# Patient Record
Sex: Female | Born: 1960 | Race: White | Hispanic: No | Smoking: Former smoker
Health system: Southern US, Community
[De-identification: ages and names within clinical notes are randomized; demographics above are authoritative.]

## PROBLEM LIST (undated history)

## (undated) DIAGNOSIS — J45909 Unspecified asthma, uncomplicated: Secondary | ICD-10-CM

## (undated) DIAGNOSIS — T79A22A Traumatic compartment syndrome of left lower extremity, initial encounter: Secondary | ICD-10-CM

## (undated) DIAGNOSIS — F419 Anxiety disorder, unspecified: Secondary | ICD-10-CM

## (undated) DIAGNOSIS — C50919 Malignant neoplasm of unspecified site of unspecified female breast: Secondary | ICD-10-CM

## (undated) DIAGNOSIS — R569 Unspecified convulsions: Secondary | ICD-10-CM

## (undated) DIAGNOSIS — F32A Depression, unspecified: Secondary | ICD-10-CM

## (undated) DIAGNOSIS — F329 Major depressive disorder, single episode, unspecified: Secondary | ICD-10-CM

## (undated) DIAGNOSIS — S7400XA Injury of sciatic nerve at hip and thigh level, unspecified leg, initial encounter: Secondary | ICD-10-CM

## (undated) DIAGNOSIS — I82409 Acute embolism and thrombosis of unspecified deep veins of unspecified lower extremity: Secondary | ICD-10-CM

## (undated) DIAGNOSIS — D649 Anemia, unspecified: Secondary | ICD-10-CM

## (undated) HISTORY — PX: CERVICAL FUSION: SHX112

## (undated) HISTORY — PX: FASCIOTOMY: SHX132

## (undated) HISTORY — PX: LUMBAR FUSION: SHX111

## (undated) HISTORY — PX: SPINAL CORD STIMULATOR IMPLANT: SHX2422

## (undated) HISTORY — PX: MASTECTOMY: SHX3

## (undated) HISTORY — PX: TONSILLECTOMY: SUR1361

---

## 2009-08-24 ENCOUNTER — Emergency Department: Payer: Self-pay | Admitting: Emergency Medicine

## 2011-03-07 ENCOUNTER — Ambulatory Visit: Payer: Self-pay

## 2013-06-25 ENCOUNTER — Ambulatory Visit: Payer: Self-pay

## 2015-12-14 ENCOUNTER — Emergency Department: Payer: Medicare Other

## 2015-12-14 ENCOUNTER — Emergency Department
Admission: EM | Admit: 2015-12-14 | Discharge: 2015-12-14 | Disposition: A | Discharge status: Home / Self Care | Payer: Medicare Other | Attending: Emergency Medicine | Admitting: Emergency Medicine

## 2015-12-14 DIAGNOSIS — R05 Cough: Secondary | ICD-10-CM | POA: Insufficient documentation

## 2015-12-14 DIAGNOSIS — R0981 Nasal congestion: Secondary | ICD-10-CM | POA: Insufficient documentation

## 2015-12-14 DIAGNOSIS — M79605 Pain in left leg: Secondary | ICD-10-CM

## 2015-12-14 HISTORY — DX: Acute embolism and thrombosis of unspecified deep veins of unspecified lower extremity: I82.409

## 2015-12-14 HISTORY — DX: Traumatic compartment syndrome of left lower extremity, initial encounter: T79.A22A

## 2015-12-14 HISTORY — DX: Injury of sciatic nerve at hip and thigh level, unspecified leg, initial encounter: S74.00XA

## 2015-12-14 NOTE — ED Provider Notes (Signed)
[facility] Emergency Department Provider Note  ____________________________________________  Time seen: Approximately 2:51 PM  I have reviewed the triage vital signs and the nursing notes.   HISTORY  Chief Complaint Leg Pain    HPI Rebecca Colon is a 55 y.o. female who complains of gradual onset left leg pain behind the knee for about 2 weeks. No associated chest pain shortness of breath or exertional symptoms. No recent travel, hospitalization or surgery. Does have a history of DVT and reports this feels similar. No significant swelling of the leg. No fevers or chills     Past Medical History:  Diagnosis Date  . 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 DVT (deep venous thrombosis) ([place])   . Sciatic nerve injury      There are no active problems to display for this patient.    Past Surgical History:  Procedure Laterality Date  . CERVICAL FUSION    . FASCIOTOMY Left   . LUMBAR FUSION    . SPINAL CORD STIMULATOR IMPLANT    . TONSILLECTOMY       Prior to Admission medications   Not on File     Allergies Penicillins   No family history on file.  Social History Social History  Substance Use Topics  . Smoking status: Never Smoker  . Smokeless tobacco: Never Used  . Alcohol use No    Review of Systems  Constitutional:   No fever or chills.  ENT:  Recent illness with sinus congestion and drainage. Cardiovascular:   No chest pain. No pleuritic symptoms Respiratory:   No dyspnea positive nonproductive cough. Gastrointestinal:   Negative for abdominal pain, vomiting and diarrhea.  Genitourinary:   Negative for dysuria or difficulty urinating. Musculoskeletal:   Negative for focal pain or swelling Neurological:   Negative for headaches 10-point ROS otherwise negative.  ____________________________________________   PHYSICAL EXAM:  VITAL SIGNS: ED Triage Vitals  Enc Vitals Group     BP 12/14/15 1148 128/76      Pulse Rate 12/14/15 1148 65     Resp 12/14/15 1148 18     Temp 12/14/15 1148 97.9 F (36.6 C)     Temp Source 12/14/15 1148 Oral     SpO2 12/14/15 1148 95 %     Weight 12/14/15 1149 200 lb (90.7 kg)     Height 12/14/15 1149 5\' 7"  (1.702 m)     Head Circumference --      Peak Flow --      Pain Score 12/14/15 1144 4     Pain Loc --      Pain Edu? --      Excl. in [place]? --     Vital signs reviewed, nursing assessments reviewed.   Constitutional:   Alert and oriented. Well appearing and in no distress. Eyes:   No scleral icterus. No conjunctival pallor. PERRL. EOMI.  No nystagmus. ENT   Head:   Normocephalic and atraumatic.   Nose:   No congestion/rhinnorhea. No septal hematoma   Mouth/Throat:   MMM, no pharyngeal erythema. No peritonsillar mass.    Neck:   No stridor. No SubQ emphysema. No meningismus. Hematological/Lymphatic/Immunilogical:   No cervical lymphadenopathy. Cardiovascular:   RRR. Symmetric bilateral radial and DP pulses.  No murmurs.  Respiratory:   Normal respiratory effort without tachypnea nor retractions. Breath sounds are clear and equal bilaterally. No wheezes/rales/rhonchi. Gastrointestinal:   Soft and nontender. Non distended. There is no CVA tenderness.  No rebound, rigidity,  or guarding. Genitourinary:   deferred Musculoskeletal:   Slight left popliteal tenderness without mass. No inflammatory changes or induration or swelling. Calf circumference is symmetric.  No edema. Neurologic:   Normal speech and language.  CN 2-10 normal. Motor grossly intact. No gross focal neurologic deficits are appreciated.  Skin:    Skin is warm, dry and intact. No rash noted.  No petechiae, purpura, or bullae.  ____________________________________________    LABS (pertinent positives/negatives) (all labs ordered are listed, but only abnormal results are displayed) Labs Reviewed - No data to  display ____________________________________________   EKG    ____________________________________________    RADIOLOGY  Ultrasound left lower extremity unremarkable  ____________________________________________   PROCEDURES Procedures  ____________________________________________   INITIAL IMPRESSION / ASSESSMENT AND PLAN / ED COURSE  Pertinent labs & imaging results that were available during my care of the patient were reviewed by me and considered in my medical decision making (see chart for details).  Patient well appearing no acute distress. Complains of left leg pain concerning for DVT. Ultrasound is negative. On exam there is not a high suspicion for DVT. No further workup or anticoagulants indicated at this time. Encouraged follow-up with primary care week for reevaluation of her symptoms and progression. NSAIDs and heat therapy for now. No evidence of soft tissue infection or acute injury.     Clinical Course   ____________________________________________   FINAL CLINICAL IMPRESSION(S) / ED DIAGNOSES  Final diagnoses:  Left leg pain       Portions of this note were generated with dragon dictation software. Dictation errors may occur despite best attempts at proofreading.    Carrie Mew, MD 12/14/15 [phone]

## 2015-12-14 NOTE — ED Triage Notes (Signed)
Pt c/o pain behind the left knee for the past couple of weeks with increased SOB.. Pt states she has a hx of DVT.Marland Kitchen

## 2015-12-14 NOTE — ED Notes (Addendum)
Pt c/o L knee pain for "a couple months".  Pt sts she feels mass when bending knee, incr pain w/ bending knee and sometimes flexion of foot.  No swelling, redness or heat bilaterally. Circulation and sensation intact.   Pt sts she has had congestion for 3-4 days.  Denies recent travel.

## 2015-12-14 NOTE — ED Notes (Signed)
Patient transported to Ultrasound 

## 2017-07-07 ENCOUNTER — Encounter: Payer: Self-pay | Admitting: *Deleted

## 2017-07-07 ENCOUNTER — Observation Stay
Admission: EM | Admit: 2017-07-07 | Discharge: 2017-07-10 | Disposition: A | Discharge status: Home / Self Care | Payer: Medicare Other | Attending: Internal Medicine | Admitting: Internal Medicine

## 2017-07-07 ENCOUNTER — Emergency Department: Payer: Medicare Other

## 2017-07-07 DIAGNOSIS — F419 Anxiety disorder, unspecified: Secondary | ICD-10-CM | POA: Diagnosis not present

## 2017-07-07 DIAGNOSIS — Z853 Personal history of malignant neoplasm of breast: Secondary | ICD-10-CM | POA: Diagnosis not present

## 2017-07-07 DIAGNOSIS — G9340 Encephalopathy, unspecified: Secondary | ICD-10-CM | POA: Diagnosis not present

## 2017-07-07 DIAGNOSIS — Z9013 Acquired absence of bilateral breasts and nipples: Secondary | ICD-10-CM | POA: Diagnosis not present

## 2017-07-07 DIAGNOSIS — J45909 Unspecified asthma, uncomplicated: Secondary | ICD-10-CM | POA: Diagnosis not present

## 2017-07-07 DIAGNOSIS — F329 Major depressive disorder, single episode, unspecified: Secondary | ICD-10-CM | POA: Insufficient documentation

## 2017-07-07 DIAGNOSIS — Z79891 Long term (current) use of opiate analgesic: Secondary | ICD-10-CM | POA: Insufficient documentation

## 2017-07-07 DIAGNOSIS — R112 Nausea with vomiting, unspecified: Principal | ICD-10-CM | POA: Insufficient documentation

## 2017-07-07 DIAGNOSIS — Z86718 Personal history of other venous thrombosis and embolism: Secondary | ICD-10-CM | POA: Insufficient documentation

## 2017-07-07 DIAGNOSIS — Z23 Encounter for immunization: Secondary | ICD-10-CM | POA: Diagnosis not present

## 2017-07-07 DIAGNOSIS — N3000 Acute cystitis without hematuria: Secondary | ICD-10-CM | POA: Diagnosis not present

## 2017-07-07 DIAGNOSIS — G8929 Other chronic pain: Secondary | ICD-10-CM | POA: Insufficient documentation

## 2017-07-07 DIAGNOSIS — R4182 Altered mental status, unspecified: Secondary | ICD-10-CM

## 2017-07-07 DIAGNOSIS — Z79899 Other long term (current) drug therapy: Secondary | ICD-10-CM | POA: Diagnosis not present

## 2017-07-07 DIAGNOSIS — L739 Follicular disorder, unspecified: Secondary | ICD-10-CM | POA: Diagnosis not present

## 2017-07-07 DIAGNOSIS — F32A Depression, unspecified: Secondary | ICD-10-CM | POA: Diagnosis present

## 2017-07-07 DIAGNOSIS — M549 Dorsalgia, unspecified: Secondary | ICD-10-CM | POA: Insufficient documentation

## 2017-07-07 DIAGNOSIS — N390 Urinary tract infection, site not specified: Secondary | ICD-10-CM

## 2017-07-07 HISTORY — DX: Unspecified asthma, uncomplicated: J45.909

## 2017-07-07 HISTORY — DX: Anemia, unspecified: D64.9

## 2017-07-07 HISTORY — DX: Anxiety disorder, unspecified: F41.9

## 2017-07-07 HISTORY — DX: Major depressive disorder, single episode, unspecified: F32.9

## 2017-07-07 HISTORY — DX: Depression, unspecified: F32.A

## 2017-07-07 LAB — CBC WITH DIFFERENTIAL/PLATELET
Basophils Absolute: 0 10*3/uL (ref 0–0.1)
Basophils Relative: 0 %
Eosinophils Absolute: 0 10*3/uL (ref 0–0.7)
Eosinophils Relative: 0 %
HEMATOCRIT: 43.3 % (ref 35.0–47.0)
Hemoglobin: 15.2 g/dL (ref 12.0–16.0)
LYMPHS PCT: 8 %
Lymphs Abs: 1 10*3/uL (ref 1.0–3.6)
MCH: 30.5 pg (ref 26.0–34.0)
MCHC: 35.2 g/dL (ref 32.0–36.0)
MCV: 86.5 fL (ref 80.0–100.0)
MONOS PCT: 8 %
Monocytes Absolute: 0.9 10*3/uL (ref 0.2–0.9)
NEUTROS ABS: 9.9 10*3/uL — AB (ref 1.4–6.5)
Neutrophils Relative %: 84 %
Platelets: 260 10*3/uL (ref 150–440)
RBC: 5 MIL/uL (ref 3.80–5.20)
RDW: 12.9 % (ref 11.5–14.5)
WBC: 11.9 10*3/uL — ABNORMAL HIGH (ref 3.6–11.0)

## 2017-07-07 LAB — URINALYSIS, COMPLETE (UACMP) WITH MICROSCOPIC
BILIRUBIN URINE: NEGATIVE
GLUCOSE, UA: NEGATIVE mg/dL
KETONES UR: 80 mg/dL — AB
Nitrite: NEGATIVE
PH: 5 (ref 5.0–8.0)
Protein, ur: 30 mg/dL — AB
Specific Gravity, Urine: 1.029 (ref 1.005–1.030)

## 2017-07-07 LAB — COMPREHENSIVE METABOLIC PANEL
ALT: 20 U/L (ref 14–54)
ANION GAP: 11 (ref 5–15)
AST: 30 U/L (ref 15–41)
Albumin: 4 g/dL (ref 3.5–5.0)
Alkaline Phosphatase: 84 U/L (ref 38–126)
BILIRUBIN TOTAL: 1.2 mg/dL (ref 0.3–1.2)
BUN: 17 mg/dL (ref 6–20)
CO2: 23 mmol/L (ref 22–32)
Calcium: 9.4 mg/dL (ref 8.9–10.3)
Chloride: 103 mmol/L (ref 101–111)
Creatinine, Ser: 0.72 mg/dL (ref 0.44–1.00)
Glucose, Bld: 122 mg/dL — ABNORMAL HIGH (ref 65–99)
POTASSIUM: 3.4 mmol/L — AB (ref 3.5–5.1)
Sodium: 137 mmol/L (ref 135–145)
TOTAL PROTEIN: 7.6 g/dL (ref 6.5–8.1)

## 2017-07-07 LAB — TROPONIN I: Troponin I: <0.03 ng/mL (ref <0.03)

## 2017-07-07 LAB — LACTIC ACID, PLASMA: Lactic Acid, Venous: 1.8 mmol/L (ref 0.5–1.9)

## 2017-07-07 NOTE — ED Notes (Signed)
Patient's daughter states she believes the patient has been without Lyrica for at least two days. Daughter found the patient's meds in an unusual spot and feels the patient forgot about the meds or slept through the times to have the meds.

## 2017-07-07 NOTE — ED Notes (Addendum)
Patient told her daughter she was unsure if she took all of her medications today. Patient was holding her legs off the stretcher and had a rapid scissor kick. Patient's daughter was worried that the patient didn't take her Lyrica for restless legs and is having withdrawals. Patient's daughter was concerned that the patient would need to be taken to [facility] for the withdrawal if "things don't happen soon." Daughter was reassured that patient would receive an IV and blood drawn and the patient would need to provide a urine specimen and that all could be done here and had been ordered. Daughter appeared to be more calm.

## 2017-07-07 NOTE — ED Triage Notes (Addendum)
Per EMS report, patient's daughter states patient has been "not acting her self" for 3 days. Patient is unable to state the date or history, but is oriented to self.  Patient was able to tell tech that right arm is protected from use due to previous "shunt."

## 2017-07-08 ENCOUNTER — Other Ambulatory Visit: Payer: Self-pay

## 2017-07-08 ENCOUNTER — Encounter: Payer: Self-pay | Admitting: Internal Medicine

## 2017-07-08 DIAGNOSIS — F419 Anxiety disorder, unspecified: Secondary | ICD-10-CM | POA: Diagnosis present

## 2017-07-08 DIAGNOSIS — F32A Depression, unspecified: Secondary | ICD-10-CM | POA: Diagnosis present

## 2017-07-08 DIAGNOSIS — F329 Major depressive disorder, single episode, unspecified: Secondary | ICD-10-CM | POA: Diagnosis present

## 2017-07-08 DIAGNOSIS — N390 Urinary tract infection, site not specified: Secondary | ICD-10-CM | POA: Diagnosis present

## 2017-07-08 DIAGNOSIS — R112 Nausea with vomiting, unspecified: Secondary | ICD-10-CM | POA: Diagnosis not present

## 2017-07-08 DIAGNOSIS — G9340 Encephalopathy, unspecified: Secondary | ICD-10-CM | POA: Diagnosis present

## 2017-07-08 LAB — CBC
HEMATOCRIT: 44.6 % (ref 35.0–47.0)
HEMOGLOBIN: 15.6 g/dL (ref 12.0–16.0)
MCH: 30.6 pg (ref 26.0–34.0)
MCHC: 35.1 g/dL (ref 32.0–36.0)
MCV: 87.3 fL (ref 80.0–100.0)
Platelets: 263 10*3/uL (ref 150–440)
RBC: 5.11 MIL/uL (ref 3.80–5.20)
RDW: 13.1 % (ref 11.5–14.5)
WBC: 10.1 10*3/uL (ref 3.6–11.0)

## 2017-07-08 LAB — BASIC METABOLIC PANEL
ANION GAP: 10 (ref 5–15)
BUN: 17 mg/dL (ref 6–20)
CHLORIDE: 102 mmol/L (ref 101–111)
CO2: 26 mmol/L (ref 22–32)
Calcium: 9.4 mg/dL (ref 8.9–10.3)
Creatinine, Ser: 0.7 mg/dL (ref 0.44–1.00)
GFR calc Af Amer: >60 mL/min (ref >60)
GLUCOSE: 108 mg/dL — AB (ref 65–99)
POTASSIUM: 3.6 mmol/L (ref 3.5–5.1)
Sodium: 138 mmol/L (ref 135–145)

## 2017-07-08 LAB — LACTIC ACID, PLASMA: Lactic Acid, Venous: 1.2 mmol/L (ref 0.5–1.9)

## 2017-07-08 MED ORDER — SODIUM CHLORIDE 0.9 % IV SOLN
1.0000 g | Freq: Once | INTRAVENOUS | Status: AC
  Administered 2017-07-08: 1 g via INTRAVENOUS
  Filled 2017-07-08: qty 10

## 2017-07-08 MED ORDER — ONDANSETRON HCL 4 MG/2ML IJ SOLN: 4.0000 mg | Freq: Four times a day (QID) | INTRAMUSCULAR | Status: DC | PRN

## 2017-07-08 MED ORDER — ACETAMINOPHEN 325 MG PO TABS
650.0000 mg | ORAL_TABLET | Freq: Four times a day (QID) | ORAL | Status: DC | PRN
  Administered 2017-07-10: 01:00:00 650 mg via ORAL
  Filled 2017-07-08: qty 2

## 2017-07-08 MED ORDER — POLYETHYLENE GLYCOL 3350 17 G PO PACK
17.0000 g | PACK | Freq: Every day | ORAL | Status: DC
  Administered 2017-07-08 – 2017-07-09 (×2): 17 g via ORAL
  Filled 2017-07-08 (×3): qty 1

## 2017-07-08 MED ORDER — ACETAMINOPHEN 650 MG RE SUPP: 650.0000 mg | Freq: Four times a day (QID) | RECTAL | Status: DC | PRN

## 2017-07-08 MED ORDER — PREGABALIN 75 MG PO CAPS
200.0000 mg | ORAL_CAPSULE | Freq: Three times a day (TID) | ORAL | Status: DC
  Administered 2017-07-08 – 2017-07-10 (×7): 200 mg via ORAL
  Filled 2017-07-08 (×7): qty 1

## 2017-07-08 MED ORDER — MORPHINE SULFATE 15 MG PO TABS
30.0000 mg | ORAL_TABLET | Freq: Two times a day (BID) | ORAL | Status: DC | PRN
  Administered 2017-07-08 – 2017-07-09 (×2): 30 mg via ORAL
  Filled 2017-07-08 (×2): qty 2

## 2017-07-08 MED ORDER — PNEUMOCOCCAL VAC POLYVALENT 25 MCG/0.5ML IJ INJ
0.5000 mL | INJECTION | INTRAMUSCULAR | Status: AC
  Administered 2017-07-09: 0.5 mL via INTRAMUSCULAR
  Filled 2017-07-08: qty 0.5

## 2017-07-08 MED ORDER — OXYCODONE HCL 5 MG PO TABS
5.0000 mg | ORAL_TABLET | ORAL | Status: DC | PRN
  Administered 2017-07-08 – 2017-07-09 (×2): 5 mg via ORAL
  Filled 2017-07-08 (×2): qty 1

## 2017-07-08 MED ORDER — ENOXAPARIN SODIUM 40 MG/0.4ML ~~LOC~~ SOLN
40.0000 mg | SUBCUTANEOUS | Status: DC
  Administered 2017-07-08 – 2017-07-09 (×2): 40 mg via SUBCUTANEOUS
  Filled 2017-07-08 (×2): qty 0.4

## 2017-07-08 MED ORDER — MONTELUKAST SODIUM 10 MG PO TABS
10.0000 mg | ORAL_TABLET | Freq: Every day | ORAL | Status: DC
  Administered 2017-07-08 – 2017-07-10 (×3): 10 mg via ORAL
  Filled 2017-07-08 (×3): qty 1

## 2017-07-08 MED ORDER — DOCUSATE SODIUM 100 MG PO CAPS
100.0000 mg | ORAL_CAPSULE | Freq: Two times a day (BID) | ORAL | Status: DC
  Administered 2017-07-08 – 2017-07-10 (×5): 100 mg via ORAL
  Filled 2017-07-08 (×5): qty 1

## 2017-07-08 MED ORDER — MORPHINE SULFATE ER 30 MG PO TBCR
60.0000 mg | EXTENDED_RELEASE_TABLET | Freq: Three times a day (TID) | ORAL | Status: DC
  Administered 2017-07-08 – 2017-07-10 (×6): 60 mg via ORAL
  Filled 2017-07-08 (×6): qty 2

## 2017-07-08 MED ORDER — BACLOFEN 10 MG PO TABS
20.0000 mg | ORAL_TABLET | Freq: Every day | ORAL | Status: DC
  Administered 2017-07-08 – 2017-07-09 (×2): 20 mg via ORAL
  Filled 2017-07-08: qty 2
  Filled 2017-07-08: qty 1
  Filled 2017-07-08: qty 2

## 2017-07-08 MED ORDER — ONDANSETRON HCL 4 MG PO TABS: 4.0000 mg | ORAL_TABLET | Freq: Four times a day (QID) | ORAL | Status: DC | PRN

## 2017-07-08 MED ORDER — SENNA 8.6 MG PO TABS
1.0000 | ORAL_TABLET | Freq: Every day | ORAL | Status: DC
  Administered 2017-07-08 – 2017-07-10 (×3): 8.6 mg via ORAL
  Filled 2017-07-08 (×3): qty 1

## 2017-07-08 MED ORDER — DULOXETINE HCL 30 MG PO CPEP
60.0000 mg | ORAL_CAPSULE | Freq: Every day | ORAL | Status: DC
  Administered 2017-07-08 – 2017-07-09 (×2): 60 mg via ORAL
  Filled 2017-07-08 (×2): qty 2

## 2017-07-08 MED ORDER — CIPROFLOXACIN IN D5W 400 MG/200ML IV SOLN
400.0000 mg | Freq: Two times a day (BID) | INTRAVENOUS | Status: DC
  Administered 2017-07-08 – 2017-07-09 (×3): 400 mg via INTRAVENOUS
  Filled 2017-07-08 (×4): qty 200

## 2017-07-08 MED ORDER — ESCITALOPRAM OXALATE 20 MG PO TABS
20.0000 mg | ORAL_TABLET | Freq: Every day | ORAL | Status: DC
  Administered 2017-07-08 – 2017-07-10 (×3): 20 mg via ORAL
  Filled 2017-07-08 (×3): qty 1

## 2017-07-08 NOTE — ED Provider Notes (Signed)
[facility] Emergency Department Provider Note   ____________________________________________   I have reviewed the triage vital signs and the nursing notes.   HISTORY  Chief Complaint Altered Mental Status   History limited by and level 5 caveat due to: Altered mental status.   HPI Rebecca Colon is a 57 y.o. female who presents to the emergency department today via EMS because of concerns for altered mental status.  Per EMS apparently the family states the patient has not been her normal mental state for the past 4 days.  Apparently the family thinks she has not been taking her medication.  She is on chronic pain medication.  Patient herself does state that she is noticed she has been more confused the past couple of days.  She however denies any headache, chest pain or shortness of breath.  She denies any recent fevers.   Per medical record review patient has a history of DVT  Past Medical History:  Diagnosis Date  . 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 DVT (deep venous thrombosis) ([place])   . Sciatic nerve injury     There are no active problems to display for this patient.   Past Surgical History:  Procedure Laterality Date  . CERVICAL FUSION    . FASCIOTOMY Left   . LUMBAR FUSION    . SPINAL CORD STIMULATOR IMPLANT    . TONSILLECTOMY      Prior to Admission medications   Not on File    Allergies Penicillins  No family history on file.  Social History Social History   Tobacco Use  . Smoking status: Never Smoker  . Smokeless tobacco: Never Used  Substance Use Topics  . Alcohol use: No  . Drug use: No    Review of Systems limited by AMS Constitutional: No fever/chills Cardiovascular: Denies chest pain. Respiratory: Denies shortness of breath. Gastrointestinal: No abdominal pain.  No nausea, no vomiting.  No diarrhea.   Neurological: Positive for  confusion. ____________________________________________   PHYSICAL EXAM:  VITAL SIGNS: ED Triage Vitals  Enc Vitals Group     BP 07/07/17 2124 (!) 141/59     Pulse Rate 07/07/17 2124 73     Resp 07/07/17 2124 18     Temp 07/07/17 2124 98.9 F (37.2 C)     Temp Source 07/07/17 2124 Oral     SpO2 07/07/17 2124 98 %     Weight 07/07/17 2126 195 lb 8 oz (88.7 kg)     Height --      Head Circumference --      Peak Flow --      Pain Score 07/07/17 2126 0   Constitutional: Awake and alert. Not oriented to events. Oriented to location.  Eyes: Conjunctivae are normal.  ENT   Head: Normocephalic and atraumatic.   Nose: No congestion/rhinnorhea.   Mouth/Throat: Mucous membranes are moist.   Neck: No stridor. Hematological/Lymphatic/Immunilogical: No cervical lymphadenopathy. Cardiovascular: Normal rate, regular rhythm.  No murmurs, rubs, or gallops.  Respiratory: Normal respiratory effort without tachypnea nor retractions. Breath sounds are clear and equal bilaterally. No wheezes/rales/rhonchi. Gastrointestinal: Soft and non tender. No rebound. No guarding.  Genitourinary: Deferred Musculoskeletal: Normal range of motion in all extremities. No lower extremity edema. Neurologic:  Normal speech and language however not completely oriented. Follows commands and moves all extremities. Sensation grossly intact.  Skin:  Skin is warm, dry and intact. No rash noted. Psychiatric: Confused. ____________________________________________    LABS (pertinent positives/negatives)  WBC 11.9, hgb 15.2, plt 260 CMP na 137, k 3.4, glu 122 UA large leukocytes, rbc and wbc 6-30  ____________________________________________   EKG  None  ____________________________________________    RADIOLOGY  CT head No acute abnormality ____________________________________________   PROCEDURES  Procedures  ____________________________________________   INITIAL IMPRESSION /  ASSESSMENT AND PLAN / ED COURSE  Pertinent labs & imaging results that were available during my care of the patient were reviewed by me and considered in my medical decision making (see chart for details).  Patient presented to the emergency department today because of concerns for altered mental status and confusion.  This has been going on for the past few days.  Patient cannot really tell me what is been going on the past couple days but does states she noticed she has been confused.  Differential would be broad including intracranial process, infection, significant anemia, electrolyte abnormality amongst other etiologies.  Patient's workup is most concerning perhaps for urinary tract infection.  This along with the patient not taking her medication could explain the altered mental status.  Discussed findings with patient's daughter.  Discussed plan of admission to the hospital service.   ____________________________________________   FINAL CLINICAL IMPRESSION(S) / ED DIAGNOSES  Final diagnoses:  Altered mental status, unspecified altered mental status type  Lower urinary tract infectious disease     Note: This dictation was prepared with Dragon dictation. Any transcriptional errors that result from this process are unintentional     Nance Pear, MD 07/08/17 1539

## 2017-07-08 NOTE — H&P (Signed)
[facility] [place] NAME: Rebecca Colon    MR#:  [id number]  DATE OF BIRTH:  Aug 06, 1960  DATE OF ADMISSION:  07/07/2017  PRIMARY CARE PHYSICIAN: Charlann Boxer, MD   REQUESTING/REFERRING PHYSICIAN: Archie Balboa, MD  CHIEF COMPLAINT:   Chief Complaint  Patient presents with  . Altered Mental Status    HISTORY OF PRESENT ILLNESS:  Rebecca Colon  is a 57 y.o. female who presents with confusion.  Patient and daughter report that the daughter was trying to get a hold of her mother for a few days and was unable.  She finally went by to see her mother and found her significantly confused.  Patient had not not taken her medications for several days, stated that she had nausea and vomiting.  Here in the ED she was found to have likely UTI.  Hospitalist were called for admission  PAST MEDICAL HISTORY:   Past Medical History:  Diagnosis Date  . Anemia   . Anxiety   . Asthma   . 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 Depression   . DVT (deep venous thrombosis) ([place])   . Sciatic nerve injury      PAST SURGICAL HISTORY:   Past Surgical History:  Procedure Laterality Date  . CERVICAL FUSION    . FASCIOTOMY Left   . LUMBAR FUSION    . SPINAL CORD STIMULATOR IMPLANT    . TONSILLECTOMY       SOCIAL HISTORY:   Social History   Tobacco Use  . Smoking status: Never Smoker  . Smokeless tobacco: Never Used  Substance Use Topics  . Alcohol use: No     FAMILY HISTORY:   Family History  Problem Relation Age of Onset  . COPD Mother   . Heart disease Mother   . Hypertension Mother   . COPD Father   . Heart disease Father   . Hypertension Father      DRUG ALLERGIES:   Allergies  Allergen Reactions  . Penicillins Anaphylaxis and Other (See Comments)    Has patient had a PCN reaction causing immediate rash, facial/tongue/throat swelling, SOB or lightheadedness with hypotension: Yes Has patient had a PCN  reaction causing severe rash involving mucus membranes or skin necrosis: Unknown Has patient had a PCN reaction that required hospitalization: Yes Has patient had a PCN reaction occurring within the last 10 years: No If all of the above answers are "NO", then may proceed with Cephalosporin use.     MEDICATIONS AT HOME:   Prior to Admission medications   Medication Sig Start Date End Date Taking? Authorizing Provider  baclofen (LIORESAL) 20 MG tablet Take 20 mg by mouth at bedtime.   Yes [provider]  DULoxetine (CYMBALTA) 60 MG capsule Take 60 mg by mouth at bedtime.   Yes [provider]  escitalopram (LEXAPRO) 20 MG tablet Take 20 mg by mouth daily.   Yes [provider]  montelukast (SINGULAIR) 10 MG tablet Take 10 mg by mouth daily.   Yes [provider]  morphine (MS CONTIN) 60 MG 12 hr tablet Take 60 mg by mouth every 8 (eight) hours.   Yes [provider]  morphine (MSIR) 30 MG tablet Take 30 mg by mouth 2 (two) times daily as needed for severe pain.   Yes [provider]  pregabalin (LYRICA) 200 MG capsule Take 200 mg by mouth 3 (three) times daily.   Yes [provider]  REVIEW OF SYSTEMS:  Review of Systems  Constitutional: Negative for chills, fever, malaise/fatigue and weight loss.  HENT: Negative for ear pain, hearing loss and tinnitus.   Eyes: Negative for blurred vision, double vision, pain and redness.  Respiratory: Negative for cough, hemoptysis and shortness of breath.   Cardiovascular: Negative for chest pain, palpitations, orthopnea and leg swelling.  Gastrointestinal: Positive for nausea and vomiting. Negative for abdominal pain, constipation and diarrhea.  Genitourinary: Negative for dysuria, frequency and hematuria.  Musculoskeletal: Negative for back pain, joint pain and neck pain.  Skin:       No acne, rash, or lesions  Neurological: Negative for dizziness, tremors, focal weakness and  weakness.       Confusion  Endo/Heme/Allergies: Negative for polydipsia. Does not bruise/bleed easily.  Psychiatric/Behavioral: Negative for depression. The patient is not nervous/anxious and does not have insomnia.      VITAL SIGNS:   Vitals:   07/07/17 2124 07/07/17 2126 07/07/17 2311  BP: (!) 141/59  (!) 158/73  Pulse: 73  76  Resp: 18  16  Temp: 98.9 F (37.2 C)    TempSrc: Oral    SpO2: 98%  99%  Weight:  88.7 kg (195 lb 8 oz)    Wt Readings from Last 3 Encounters:  07/07/17 88.7 kg (195 lb 8 oz)  12/14/15 90.7 kg (200 lb)    PHYSICAL EXAMINATION:  Physical Exam  Vitals reviewed. Constitutional: She appears well-developed and well-nourished. No distress.  HENT:  Head: Normocephalic and atraumatic.  Mouth/Throat: Oropharynx is clear and moist.  Eyes: Pupils are equal, round, and reactive to light. Conjunctivae and EOM are normal. No scleral icterus.  Neck: Normal range of motion. Neck supple. No JVD present. No thyromegaly present.  Cardiovascular: Normal rate, regular rhythm and intact distal pulses. Exam reveals no gallop and no friction rub.  No murmur heard. Respiratory: Effort normal and breath sounds normal. No respiratory distress. She has no wheezes. She has no rales.  GI: Soft. Bowel sounds are normal. She exhibits no distension. There is no tenderness.  Musculoskeletal: Normal range of motion. She exhibits no edema.  No arthritis, no gout  Lymphadenopathy:    She has no cervical adenopathy.  Neurological: She is alert. No cranial nerve deficit.  Patient is oriented to person and place, and somewhat to circumstance.  However, she still demonstrates some mild confusion in conversation.  Skin: Skin is warm and dry. No rash noted. No erythema.  Psychiatric:  Unable to fully assess due to patient condition    LABORATORY PANEL:   CBC Recent Labs  Lab 07/07/17 2227  WBC 11.9*  HGB 15.2  HCT 43.3  PLT 260    ------------------------------------------------------------------------------------------------------------------  Chemistries  Recent Labs  Lab 07/07/17 2227  NA 137  K 3.4*  CL 103  CO2 23  GLUCOSE 122*  BUN 17  CREATININE 0.72  CALCIUM 9.4  AST 30  ALT 20  ALKPHOS 84  BILITOT 1.2   ------------------------------------------------------------------------------------------------------------------  Cardiac Enzymes Recent Labs  Lab 07/07/17 2227  TROPONINI <0.03   ------------------------------------------------------------------------------------------------------------------  RADIOLOGY:  Ct Head Wo Contrast  Result Date: 07/07/2017 CLINICAL DATA:  Altered level consciousness EXAM: CT HEAD WITHOUT CONTRAST TECHNIQUE: Contiguous axial images were obtained from the base of the skull through the vertex without intravenous contrast. COMPARISON:  March 07, 2011 FINDINGS: Brain: No evidence of acute infarction, hemorrhage, hydrocephalus, extra-axial collection or mass lesion/mass effect. Vascular: No hyperdense vessel or unexpected calcification. Skull: Normal. Negative for fracture or focal  lesion. Sinuses/Orbits: Orbits are normal. Mucoperiosteal thickening of the left sphenoid sinus is noted. Other: None. IMPRESSION: No focal acute intracranial abnormality identified. Electronically Signed   By: Abelardo Diesel M.D.   On: 07/07/2017 22:17    EKG:  No orders found for this or any previous visit.  IMPRESSION AND PLAN:  Principal Problem:   UTI (urinary tract infection) -IV antibiotics started, urine culture ordered Active Problems:   Acute encephalopathy -likely due to her UTI, seems to have improved though patient does not seem to be back to her true baseline.  Treat as above and monitor   Anxiety -home dose anxiolytics   Depression -home dose antidepressant  Chart review performed and case discussed with ED provider. Labs, imaging and/or ECG reviewed by provider and  discussed with patient/family. Management plans discussed with the patient and/or family.  DVT PROPHYLAXIS: SubQ lovenox  GI PROPHYLAXIS: None  ADMISSION STATUS: Observation  CODE STATUS: Full  TOTAL TIME TAKING CARE OF THIS PATIENT: 40 minutes.   Ilisa Hayworth [place] 07/08/2017, 12:55 AM  [facility]  Office  [phone]  CC: Primary care physician; Charlann Boxer, MD  Note:  This document was prepared using Dragon voice recognition software and may include unintentional dictation errors.

## 2017-07-08 NOTE — Care Management Obs Status (Signed)
[place] NOTIFICATION   Patient Details  Name: Rebecca Colon MRN: [id number] Date of Birth: 29-Jun-1960   Medicare Observation Status Notification Given:  Yes    Katrina Stack, RN 07/08/2017, 4:30 PM

## 2017-07-08 NOTE — Progress Notes (Signed)
Patient here with encephalopathy due to UTI and history of chronic pain on chronic narcotics Continue management as per admitting MD I restarted her pain medications that she is complaining of pain Follow-up on urine culture.

## 2017-07-09 DIAGNOSIS — R112 Nausea with vomiting, unspecified: Secondary | ICD-10-CM | POA: Diagnosis not present

## 2017-07-09 LAB — URINE CULTURE

## 2017-07-09 LAB — HIV ANTIBODY (ROUTINE TESTING W REFLEX): HIV Screen 4th Generation wRfx: NONREACTIVE

## 2017-07-09 MED ORDER — SULFAMETHOXAZOLE-TRIMETHOPRIM 800-160 MG PO TABS
1.0000 | ORAL_TABLET | Freq: Two times a day (BID) | ORAL | Status: DC
  Administered 2017-07-09 – 2017-07-10 (×2): 1 via ORAL
  Filled 2017-07-09 (×3): qty 1

## 2017-07-09 NOTE — Progress Notes (Signed)
[facility] [place] NAME: Rebecca Colon    MR#:  [id number]  DATE OF BIRTH:  1960-04-27  SUBJECTIVE:  CHIEF COMPLAINT:   Chief Complaint  Patient presents with  . Altered Mental Status   - feels better, still nausea - urine cultures with multiple bacteria  REVIEW OF SYSTEMS:  Review of Systems  Constitutional: Negative for chills, fever and malaise/fatigue.  Respiratory: Negative for cough, shortness of breath and wheezing.   Cardiovascular: Negative for chest pain and palpitations.  Gastrointestinal: Positive for nausea. Negative for abdominal pain, constipation, diarrhea and vomiting.  Genitourinary: Negative for dysuria.  Musculoskeletal: Positive for back pain and myalgias.  Neurological: Negative for dizziness, focal weakness, seizures, weakness and headaches.  Psychiatric/Behavioral: Negative for depression.    DRUG ALLERGIES:   Allergies  Allergen Reactions  . Penicillins Anaphylaxis and Other (See Comments)    **Patient received ceftriaxone 1g IV in ED on 07/08/17 w/o ADR Has patient had a PCN reaction causing immediate rash, facial/tongue/throat swelling, SOB or lightheadedness with hypotension: Yes Has patient had a PCN reaction causing severe rash involving mucus membranes or skin necrosis: Unknown Has patient had a PCN reaction that required hospitalization: Yes Has patient had a PCN reaction occurring within the last 10 years: No If all above answers are "NO", then may proceed with Cephalosporin use.    VITALS:  Blood pressure 115/69, pulse (!) 57, temperature 97.7 F (36.5 C), temperature source Oral, resp. rate 16, height 5\' 6"  (1.676 m), weight 93 kg (205 lb 0.4 oz), SpO2 97 %.  PHYSICAL EXAMINATION:  Physical Exam  GENERAL:  57 y.o.-year-old patient lying in the bed with no acute distress.  EYES: Pupils equal, round, reactive to light and accommodation. No scleral icterus. Extraocular muscles intact.  HEENT:  Head atraumatic, normocephalic. Oropharynx and nasopharynx clear.  NECK:  Supple, no jugular venous distention. No thyroid enlargement, no tenderness.  LUNGS: Normal breath sounds bilaterally, no wheezing, rales,rhonchi or crepitation. No use of accessory muscles of respiration.  CARDIOVASCULAR: S1, S2 normal. No murmurs, rubs, or gallops.  Breast augmentation surgery scars- well healing. Left axilla- open wound with some purulent discharge ABDOMEN: Soft, nontender, nondistended. Bowel sounds present. No organomegaly or mass.  EXTREMITIES: No pedal edema, cyanosis, or clubbing.  NEUROLOGIC: Cranial nerves II through XII are intact. Muscle strength 5/5 in all extremities. Sensation intact. Gait not checked.  PSYCHIATRIC: The patient is alert and oriented x 3.  SKIN: No obvious rash, lesion, or ulcer.    LABORATORY PANEL:   CBC Recent Labs  Lab 07/08/17 0254  WBC 10.1  HGB 15.6  HCT 44.6  PLT 263   ------------------------------------------------------------------------------------------------------------------  Chemistries  Recent Labs  Lab 07/07/17 2227 07/08/17 0254  NA 137 138  K 3.4* 3.6  CL 103 102  CO2 23 26  GLUCOSE 122* 108*  BUN 17 17  CREATININE 0.72 0.70  CALCIUM 9.4 9.4  AST 30  --   ALT 20  --   ALKPHOS 84  --   BILITOT 1.2  --    ------------------------------------------------------------------------------------------------------------------  Cardiac Enzymes Recent Labs  Lab 07/07/17 2227  TROPONINI <0.03   ------------------------------------------------------------------------------------------------------------------  RADIOLOGY:  Ct Head Wo Contrast  Result Date: 07/07/2017 CLINICAL DATA:  Altered level consciousness EXAM: CT HEAD WITHOUT CONTRAST TECHNIQUE: Contiguous axial images were obtained from the base of the skull through the vertex without intravenous contrast. COMPARISON:  March 07, 2011 FINDINGS: Brain: No evidence of acute  infarction, hemorrhage,  hydrocephalus, extra-axial collection or mass lesion/mass effect. Vascular: No hyperdense vessel or unexpected calcification. Skull: Normal. Negative for fracture or focal lesion. Sinuses/Orbits: Orbits are normal. Mucoperiosteal thickening of the left sphenoid sinus is noted. Other: None. IMPRESSION: No focal acute intracranial abnormality identified. Electronically Signed   By: Abelardo Diesel M.D.   On: 07/07/2017 22:17    EKG:  No orders found for this or any previous visit.  ASSESSMENT AND PLAN:   57 year old female with past medical history significant for chronic pain secondary to back issues, recent double mastectomy done for breast cancer presents to hospital secondary to altered mental status, nausea and vomiting.   1. Acute nausea/vomiting- improved - secondary to underlying infection  2. Acute cystitis- urine cultures pending - on cipro- change to oral bactrim - fluids  3. Acute folliculitis- with open wounds under axilla- started on bactrim Labs normal  4. Chronic back pain- continue home meds - on MS contin, MSIR, Cymbalta and baclofen  5. Breast cancer-status post double mastectomy, breast augmentation surgery and right lymph node resection. Outpatient follow-up with surgery as recommended  6. DVT prophylaxis-Lovenox    All the records are reviewed and case discussed with Care Management/Social Worker. Management plans discussed with the patient, family and they are in agreement.  CODE STATUS: Full Code  TOTAL TIME TAKING CARE OF THIS PATIENT: 37 minutes.   POSSIBLE D/C TOMORROW, DEPENDING ON CLINICAL CONDITION.   Gladstone Lighter M.D on 07/09/2017 at 12:01 PM  Between 7am to 6pm - Pager - [phone]  After 6pm go to [email or web address] - password [email or web address] [facility]  Office  [phone]  CC: Primary care physician; Charlann Boxer, MD

## 2017-07-10 DIAGNOSIS — R112 Nausea with vomiting, unspecified: Secondary | ICD-10-CM | POA: Diagnosis not present

## 2017-07-10 MED ORDER — FLUCONAZOLE 100 MG PO TABS: 100.0000 mg | ORAL_TABLET | Freq: Every day | ORAL | 0 refills | Status: AC

## 2017-07-10 MED ORDER — DOCUSATE SODIUM 100 MG PO CAPS: 100.0000 mg | ORAL_CAPSULE | Freq: Two times a day (BID) | ORAL | 0 refills | Status: DC

## 2017-07-10 MED ORDER — SULFAMETHOXAZOLE-TRIMETHOPRIM 800-160 MG PO TABS: 1.0000 | ORAL_TABLET | Freq: Two times a day (BID) | ORAL | 0 refills | Status: DC

## 2017-07-10 NOTE — Plan of Care (Signed)
  RD consulted for nutrition education.  Met with patient at bedside. She is dressed and ready for discharge today. She reports that she had breast cancer and is now s/p double mastectomy and reconstruction surgery. She was the sole caregiver of her youngest daughter after her divorce and her daughter has now passed away. She is learning how to live alone now and take care of herself. She is also on a limited income and would like to eat healthier on a budget.   RD provided "General, Healthful Nutrition Therapy" handout from the Academy of [employer] Dietetics. Emphasized the importance of including fiber in the form of fruits, vegetables, whole grains, and legumes. Encouraged adequate hydration with an increase in fiber (at least 8- 8 fl oz cups daily). Encouraged adequate intake of protein in diet and encouraged use of alternatives to red meat on some day (eggs, fish, legumes). Discussed limiting sodium and added sugars in diet. Provided lists on foods recommended. Teach back method used.  Expect good compliance.  Body mass index is 33.09 kg/m. Pt meets criteria for Obesity Class I based on current BMI.  Current diet order is Regular, patient is consuming approximately 100% of meals at this time. Labs and medications reviewed. No further nutrition interventions warranted at this time. RD contact information provided. If additional nutrition issues arise, please re-consult RD.  Willey Blade, MS, [place], LDN Office: [phone] Pager: [phone] After Hours/Weekend Pager: [phone]

## 2017-07-10 NOTE — Discharge Planning (Signed)
IV removed.  RN assessment and VS revealed stability for DC to home.  Discharge papers, given, explained and educated.  Informed of suggested FU appt and appt made.  Scripts e-scribed to [facility] [employer]).  Once ready, walked to front and family transporting home via car.

## 2017-07-10 NOTE — Plan of Care (Signed)

## 2017-07-10 NOTE — Care Management (Signed)
Spoke with patient regarding code 74. Explained code 44 again. Patient was very appreciative of RNCM assistance.

## 2017-07-11 NOTE — Discharge Summary (Signed)
[place] at [place] NAME: Rebecca Colon    MR#:  [id number]  DATE OF BIRTH:  1960/07/30  DATE OF ADMISSION:  07/07/2017   ADMITTING PHYSICIAN: Lance Coon, MD  DATE OF DISCHARGE: 07/10/2017 12:25 PM  PRIMARY CARE PHYSICIAN: Charlann Boxer, MD   ADMISSION DIAGNOSIS:   Lower urinary tract infectious disease [N39.0] Altered mental status, unspecified altered mental status type [R41.82]  DISCHARGE DIAGNOSIS:   Principal Problem:   UTI (urinary tract infection) Active Problems:   Acute encephalopathy   Anxiety   Depression   SECONDARY DIAGNOSIS:   Past Medical History:  Diagnosis Date  . Anemia   . Anxiety   . Asthma   . Compartment syndrome of left lower extremity ([place])   . Depression   . DVT (deep venous thrombosis) ([place])   . Sciatic nerve injury     HOSPITAL COURSE:    57 year old female with past medical history significant for chronic pain secondary to back issues, recent double mastectomy done for breast cancer presents to hospital secondary to altered mental status, nausea and vomiting.   1. Acute nausea/vomiting- improved - secondary to underlying infection  2. Acute cystitis- urine cultures with mixed organisms - changed to oral bactrim - received fluids  3. Acute folliculitis- with open wounds under axilla- on bactrim -Follow-up with surgeon as outpatient Labs normal  4. Chronic back pain- continue home meds - on MS contin, MSIR, Cymbalta and baclofen  5. Breast cancer-status post double mastectomy, breast augmentation surgery and right lymph node resection. Outpatient follow-up with surgery as recommended  Patient is back to her baseline, will be discharged today    DISCHARGE CONDITIONS:   Guarded  CONSULTS OBTAINED:   None  DRUG ALLERGIES:   Allergies  Allergen Reactions  . Penicillins Anaphylaxis and Other (See Comments)    **Patient received ceftriaxone 1g IV in ED on 07/08/17  w/o ADR Has patient had a PCN reaction causing immediate rash, facial/tongue/throat swelling, SOB or lightheadedness with hypotension: Yes Has patient had a PCN reaction causing severe rash involving mucus membranes or skin necrosis: Unknown Has patient had a PCN reaction that required hospitalization: Yes Has patient had a PCN reaction occurring within the last 10 years: No If all above answers are "NO", then may proceed with Cephalosporin use.   DISCHARGE MEDICATIONS:   Allergies as of 07/10/2017      Reactions   Penicillins Anaphylaxis, Other (See Comments)   **Patient received ceftriaxone 1g IV in ED on 07/08/17 w/o ADR Has patient had a PCN reaction causing immediate rash, facial/tongue/throat swelling, SOB or lightheadedness with hypotension: Yes Has patient had a PCN reaction causing severe rash involving mucus membranes or skin necrosis: Unknown Has patient had a PCN reaction that required hospitalization: Yes Has patient had a PCN reaction occurring within the last 10 years: No If all above answers are "NO", then may proceed with Cephalosporin use.      Medication List    TAKE these medications   baclofen 20 MG tablet Commonly known as:  LIORESAL Take 20 mg by mouth at bedtime.   docusate sodium 100 MG capsule Commonly known as:  COLACE Take 1 capsule (100 mg total) by mouth 2 (two) times daily.   DULoxetine 60 MG capsule Commonly known as:  CYMBALTA Take 60 mg by mouth at bedtime.   escitalopram 20 MG tablet Commonly known as:  LEXAPRO Take 20 mg by mouth daily.   fluconazole 100 MG tablet  Commonly known as:  DIFLUCAN Take 1 tablet (100 mg total) by mouth daily for 5 days.   montelukast 10 MG tablet Commonly known as:  SINGULAIR Take 10 mg by mouth daily.   morphine 30 MG tablet Commonly known as:  MSIR Take 30 mg by mouth 2 (two) times daily as needed for severe pain.   morphine 60 MG 12 hr tablet Commonly known as:  MS CONTIN Take 60 mg by mouth every 8  (eight) hours.   pregabalin 200 MG capsule Commonly known as:  LYRICA Take 200 mg by mouth 3 (three) times daily.   sulfamethoxazole-trimethoprim 800-160 MG tablet Commonly known as:  BACTRIM DS,SEPTRA DS Take 1 tablet by mouth every 12 (twelve) hours. X 10 days        DISCHARGE INSTRUCTIONS:   1. PCP f/u in 1-2 weeks 2. Surgery f/u for breast  Surgery scars as prior scheduled this week  DIET:   Regular diet  ACTIVITY:   Activity as tolerated  OXYGEN:   Home Oxygen: No.  Oxygen Delivery: room air  DISCHARGE LOCATION:   home   If you experience worsening of your admission symptoms, develop shortness of breath, life threatening emergency, suicidal or homicidal thoughts you must seek medical attention immediately by calling 911 or calling your MD immediately  if symptoms less severe.  You Must read complete instructions/literature along with all the possible adverse reactions/side effects for all the Medicines you take and that have been prescribed to you. Take any new Medicines after you have completely understood and accpet all the possible adverse reactions/side effects.   Please note  You were cared for by a hospitalist during your hospital stay. If you have any questions about your discharge medications or the care you received while you were in the hospital after you are discharged, you can call the unit and asked to speak with the hospitalist on call if the hospitalist that took care of you is not available. Once you are discharged, your primary care physician will handle any further medical issues. Please note that NO REFILLS for any discharge medications will be authorized once you are discharged, as it is imperative that you return to your primary care physician (or establish a relationship with a primary care physician if you do not have one) for your aftercare needs so that they can reassess your need for medications and monitor your lab values.    On the day of  Discharge:  VITAL SIGNS:   Blood pressure 123/74, pulse (!) 55, temperature 98.4 F (36.9 C), temperature source Oral, resp. rate 18, height 5\' 6"  (1.676 m), weight 93 kg (205 lb 0.4 oz), SpO2 94 %.  PHYSICAL EXAMINATION:    GENERAL:  57 y.o.-year-old patient lying in the bed with no acute distress.  EYES: Pupils equal, round, reactive to light and accommodation. No scleral icterus. Extraocular muscles intact.  HEENT: Head atraumatic, normocephalic. Oropharynx and nasopharynx clear.  NECK:  Supple, no jugular venous distention. No thyroid enlargement, no tenderness.  LUNGS: Normal breath sounds bilaterally, no wheezing, rales,rhonchi or crepitation. No use of accessory muscles of respiration.  CARDIOVASCULAR: S1, S2 normal. No murmurs, rubs, or gallops.  Breast augmentation surgery scars- well healing. Left axilla- open wound with some purulent discharge ABDOMEN: Soft, nontender, nondistended. Bowel sounds present. No organomegaly or mass.  EXTREMITIES: No pedal edema, cyanosis, or clubbing.  NEUROLOGIC: Cranial nerves II through XII are intact. Muscle strength 5/5 in all extremities. Sensation intact. Gait not checked.  PSYCHIATRIC:  The patient is alert and oriented x 3.  SKIN: No obvious rash, lesion, or ulcer.    DATA REVIEW:   CBC Recent Labs  Lab 07/08/17 0254  WBC 10.1  HGB 15.6  HCT 44.6  PLT 263    Chemistries  Recent Labs  Lab 07/07/17 2227 07/08/17 0254  NA 137 138  K 3.4* 3.6  CL 103 102  CO2 23 26  GLUCOSE 122* 108*  BUN 17 17  CREATININE 0.72 0.70  CALCIUM 9.4 9.4  AST 30  --   ALT 20  --   ALKPHOS 84  --   BILITOT 1.2  --      Microbiology Results  Results for orders placed or performed during the hospital encounter of 07/07/17  Urine Culture     Status: Abnormal   Collection Time: 07/07/17 10:27 PM  Result Value Ref Range Status   Specimen Description   Final    URINE, RANDOM Performed at [facility], [address],  [place], [place] [address]    Special Requests   Final    NONE Performed at [facility], [address] [place], [place] [address]    Culture MULTIPLE SPECIES PRESENT, SUGGEST RECOLLECTION (A)  Final   Report Status 07/09/2017 FINAL  Final    RADIOLOGY:  No results found.   Management plans discussed with the patient, family and they are in agreement.  CODE STATUS:  Code Status History    Date Active Date Inactive Code Status Order ID Comments User Context   07/08/2017 0223 07/10/2017 1530 Full Code [id number]  Lance Coon, MD Inpatient      TOTAL TIME TAKING CARE OF THIS PATIENT: 39 minutes.    Gladstone Lighter M.D on 07/11/2017 at 2:51 PM  Between 7am to 6pm - Pager - [phone]  After 6pm go to [email or web address] - Proofreader  [facility]  Office  [phone]  CC: Primary care physician; Charlann Boxer, MD   Note: This dictation was prepared with Dragon dictation along with smaller phrase technology. Any transcriptional errors that result from this process are unintentional.

## 2017-11-11 ENCOUNTER — Other Ambulatory Visit: Payer: Self-pay

## 2017-11-11 ENCOUNTER — Inpatient Hospital Stay
Admission: EM | Admit: 2017-11-11 | Discharge: 2017-11-12 | DRG: 920 | Disposition: A | Discharge status: Other Acute Inpatient Hospital | Payer: Medicare Other | Attending: Internal Medicine | Admitting: Internal Medicine

## 2017-11-11 ENCOUNTER — Emergency Department: Payer: Medicare Other

## 2017-11-11 DIAGNOSIS — N61 Mastitis without abscess: Secondary | ICD-10-CM

## 2017-11-11 DIAGNOSIS — L0390 Cellulitis, unspecified: Secondary | ICD-10-CM | POA: Diagnosis present

## 2017-11-11 DIAGNOSIS — F32A Depression, unspecified: Secondary | ICD-10-CM | POA: Diagnosis present

## 2017-11-11 DIAGNOSIS — F329 Major depressive disorder, single episode, unspecified: Secondary | ICD-10-CM | POA: Diagnosis present

## 2017-11-11 DIAGNOSIS — J45909 Unspecified asthma, uncomplicated: Secondary | ICD-10-CM | POA: Diagnosis present

## 2017-11-11 DIAGNOSIS — E669 Obesity, unspecified: Secondary | ICD-10-CM | POA: Diagnosis present

## 2017-11-11 DIAGNOSIS — Z7951 Long term (current) use of inhaled steroids: Secondary | ICD-10-CM

## 2017-11-11 DIAGNOSIS — Y831 Surgical operation with implant of artificial internal device as the cause of abnormal reaction of the patient, or of later complication, without mention of misadventure at the time of the procedure: Secondary | ICD-10-CM | POA: Diagnosis present

## 2017-11-11 DIAGNOSIS — Z880 Allergy status to penicillin: Secondary | ICD-10-CM

## 2017-11-11 DIAGNOSIS — Z86718 Personal history of other venous thrombosis and embolism: Secondary | ICD-10-CM

## 2017-11-11 DIAGNOSIS — Z9882 Breast implant status: Secondary | ICD-10-CM

## 2017-11-11 DIAGNOSIS — Z9013 Acquired absence of bilateral breasts and nipples: Secondary | ICD-10-CM

## 2017-11-11 DIAGNOSIS — E871 Hypo-osmolality and hyponatremia: Secondary | ICD-10-CM | POA: Diagnosis present

## 2017-11-11 DIAGNOSIS — N611 Abscess of the breast and nipple: Secondary | ICD-10-CM | POA: Diagnosis present

## 2017-11-11 DIAGNOSIS — Z825 Family history of asthma and other chronic lower respiratory diseases: Secondary | ICD-10-CM

## 2017-11-11 DIAGNOSIS — Z8249 Family history of ischemic heart disease and other diseases of the circulatory system: Secondary | ICD-10-CM

## 2017-11-11 DIAGNOSIS — F419 Anxiety disorder, unspecified: Secondary | ICD-10-CM | POA: Diagnosis present

## 2017-11-11 DIAGNOSIS — T8579XA Infection and inflammatory reaction due to other internal prosthetic devices, implants and grafts, initial encounter: Principal | ICD-10-CM | POA: Diagnosis present

## 2017-11-11 DIAGNOSIS — W19XXXA Unspecified fall, initial encounter: Secondary | ICD-10-CM

## 2017-11-11 DIAGNOSIS — Z853 Personal history of malignant neoplasm of breast: Secondary | ICD-10-CM

## 2017-11-11 DIAGNOSIS — Z6832 Body mass index (BMI) 32.0-32.9, adult: Secondary | ICD-10-CM

## 2017-11-11 DIAGNOSIS — S161XXA Strain of muscle, fascia and tendon at neck level, initial encounter: Secondary | ICD-10-CM

## 2017-11-11 HISTORY — DX: Malignant neoplasm of unspecified site of unspecified female breast: C50.919

## 2017-11-11 LAB — CBC WITH DIFFERENTIAL/PLATELET
BAND NEUTROPHILS: 8 %
BASOS ABS: 0 10*3/uL (ref 0–0.1)
BLASTS: 0 %
Basophils Relative: 0 %
EOS ABS: 0 10*3/uL (ref 0–0.7)
Eosinophils Relative: 0 %
HCT: 41.1 % (ref 35.0–47.0)
HEMOGLOBIN: 14.7 g/dL (ref 12.0–16.0)
Lymphocytes Relative: 4 %
Lymphs Abs: 1.5 10*3/uL (ref 1.0–3.6)
MCH: 30.9 pg (ref 26.0–34.0)
MCHC: 35.8 g/dL (ref 32.0–36.0)
MCV: 86.2 fL (ref 80.0–100.0)
METAMYELOCYTES PCT: 0 %
Monocytes Absolute: 4.1 10*3/uL — ABNORMAL HIGH (ref 0.2–0.9)
Monocytes Relative: 11 %
Myelocytes: 0 %
Neutro Abs: 31.8 10*3/uL — ABNORMAL HIGH (ref 1.4–6.5)
Neutrophils Relative %: 77 %
Other: 0 %
PROMYELOCYTES RELATIVE: 0 %
Platelets: 324 10*3/uL (ref 150–440)
RBC: 4.77 MIL/uL (ref 3.80–5.20)
RDW: 12.8 % (ref 11.5–14.5)
WBC: 37.4 10*3/uL — AB (ref 3.6–11.0)
nRBC: 0 /100 WBC

## 2017-11-11 LAB — URINALYSIS, COMPLETE (UACMP) WITH MICROSCOPIC
BILIRUBIN URINE: NEGATIVE
Bacteria, UA: NONE SEEN
GLUCOSE, UA: NEGATIVE mg/dL
HGB URINE DIPSTICK: NEGATIVE
Ketones, ur: 20 mg/dL — AB
NITRITE: NEGATIVE
Protein, ur: 30 mg/dL — AB
Specific Gravity, Urine: 1.014 (ref 1.005–1.030)
pH: 6 (ref 5.0–8.0)

## 2017-11-11 LAB — COMPREHENSIVE METABOLIC PANEL
ALBUMIN: 3 g/dL — AB (ref 3.5–5.0)
ALK PHOS: 108 U/L (ref 38–126)
ALT: 55 U/L — ABNORMAL HIGH (ref 0–44)
AST: 77 U/L — ABNORMAL HIGH (ref 15–41)
Anion gap: 15 (ref 5–15)
BILIRUBIN TOTAL: 1.4 mg/dL — AB (ref 0.3–1.2)
BUN: 10 mg/dL (ref 6–20)
CALCIUM: 8.3 mg/dL — AB (ref 8.9–10.3)
CO2: 25 mmol/L (ref 22–32)
Chloride: 92 mmol/L — ABNORMAL LOW (ref 98–111)
Creatinine, Ser: 0.94 mg/dL (ref 0.44–1.00)
GFR calc Af Amer: >60 mL/min (ref >60)
GLUCOSE: 143 mg/dL — AB (ref 70–99)
POTASSIUM: 3.3 mmol/L — AB (ref 3.5–5.1)
Sodium: 132 mmol/L — ABNORMAL LOW (ref 135–145)
TOTAL PROTEIN: 7.3 g/dL (ref 6.5–8.1)

## 2017-11-11 LAB — URINE DRUG SCREEN, QUALITATIVE (ARMC ONLY)
Amphetamines, Ur Screen: NOT DETECTED
BARBITURATES, UR SCREEN: NOT DETECTED
CANNABINOID 50 NG, UR ~~LOC~~: NOT DETECTED
Cocaine Metabolite,Ur ~~LOC~~: NOT DETECTED
MDMA (ECSTASY) UR SCREEN: NOT DETECTED
Methadone Scn, Ur: NOT DETECTED
Opiate, Ur Screen: POSITIVE — AB
PHENCYCLIDINE (PCP) UR S: NOT DETECTED
Tricyclic, Ur Screen: NOT DETECTED

## 2017-11-11 LAB — LACTIC ACID, PLASMA: LACTIC ACID, VENOUS: 1.8 mmol/L (ref 0.5–1.9)

## 2017-11-11 MED ORDER — CLINDAMYCIN PHOSPHATE 600 MG/50ML IV SOLN
600.0000 mg | Freq: Once | INTRAVENOUS | Status: AC
Start: 2017-11-11 — End: 2017-11-11
  Administered 2017-11-11: 600 mg via INTRAVENOUS
  Filled 2017-11-11 (×2): qty 50

## 2017-11-11 NOTE — ED Provider Notes (Signed)
[facility] Emergency Department Provider Note ____________________________________________   First MD Initiated Contact with Patient 11/11/17 2140     (approximate)  I have reviewed the triage vital signs and the nursing notes.   HISTORY  Chief Complaint Fall  Level 5 caveat: History of present illness limited due to altered mental status/intoxication  HPI Rebecca Colon is a 57 y.o. female with PMH as noted below including chronic pain on morphine and MS Contin who presents after an apparent fall.  The patient states she fell either last night or this morning but is not sure which, and states that it happened while she was walking and from standing height.  She reports primarily pain to her neck and to her chest.  The patient also reports that she is being treated for cellulitis of her right breast and axilla.  She was seen as an outpatient several days ago and started on an antibiotic but states that it was not getting better and she was planning to come to the ER anyway.  She reports using her chronic pain medication but denies any illicit drug or alcohol use.   Past Medical History:  Diagnosis Date  . Anemia   . Anxiety   . Asthma   . 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 Depression   . DVT (deep venous thrombosis) ([place])   . Sciatic nerve injury     Patient Active Problem List   Diagnosis Date Noted  . Acute encephalopathy 07/08/2017  . UTI (urinary tract infection) 07/08/2017  . Anxiety 07/08/2017  . Depression 07/08/2017    Past Surgical History:  Procedure Laterality Date  . CERVICAL FUSION    . FASCIOTOMY Left   . LUMBAR FUSION    . MASTECTOMY    . SPINAL CORD STIMULATOR IMPLANT    . TONSILLECTOMY      Prior to Admission medications   Medication Sig Start Date End Date Taking? Authorizing Provider  sulfamethoxazole-trimethoprim (BACTRIM DS,SEPTRA DS) 800-160 MG tablet Take 1 tablet by mouth 2 (two) times daily.  11/07/17 11/17/17 Yes [provider]  tiotropium (SPIRIVA) 18 MCG inhalation capsule Place 18 mcg into inhaler and inhale daily.   Yes [provider]  baclofen (LIORESAL) 20 MG tablet Take 20 mg by mouth at bedtime.    [provider]  docusate sodium (COLACE) 100 MG capsule Take 1 capsule (100 mg total) by mouth 2 (two) times daily. 07/10/17   Gladstone Lighter, MD  DULoxetine (CYMBALTA) 60 MG capsule Take 60 mg by mouth at bedtime.    [provider]  escitalopram (LEXAPRO) 20 MG tablet Take 20 mg by mouth daily.    [provider]  montelukast (SINGULAIR) 10 MG tablet Take 10 mg by mouth daily.    [provider]  morphine (MS CONTIN) 60 MG 12 hr tablet Take 60 mg by mouth every 8 (eight) hours.    [provider]  morphine (MSIR) 30 MG tablet Take 30 mg by mouth 2 (two) times daily as needed for severe pain.    [provider]  pregabalin (LYRICA) 200 MG capsule Take 200 mg by mouth 3 (three) times daily.    [provider]  sulfamethoxazole-trimethoprim (BACTRIM DS,SEPTRA DS) 800-160 MG tablet Take 1 tablet by mouth every 12 (twelve) hours. X 10 days Patient not taking: Reported on 11/11/2017 07/10/17   Gladstone Lighter, MD    Allergies Penicillins  Family History  Problem Relation Age of Onset  .  COPD Mother   . Heart disease Mother   . Hypertension Mother   . COPD Father   . Heart disease Father   . Hypertension Father     Social History Social History   Tobacco Use  . Smoking status: Never Smoker  . Smokeless tobacco: Never Used  Substance Use Topics  . Alcohol use: No  . Drug use: No    Review of Systems Level 5 caveat: Unable to obtain review of systems due to altered mental status/intoxication    ____________________________________________   PHYSICAL EXAM:  VITAL SIGNS: ED Triage Vitals [11/11/17 2134]  Enc Vitals Group     BP 134/89     Pulse Rate 98     Resp 18     Temp  98.4 F (36.9 C)     Temp Source Oral     SpO2 94 %     Weight      Height      Head Circumference      Peak Flow      Pain Score 6     Pain Loc      Pain Edu?      Excl. in [place]?     Constitutional: Somnolent but arousable to loud voice.  Uncomfortable appearing.  No acute distress. Eyes: Conjunctivae are normal.  EOMI.  PERRLA. Head: Atraumatic. Nose: No congestion/rhinnorhea. Mouth/Throat: Mucous membranes are slightly dry.   Neck: Normal range of motion.  Mild midline tenderness with no step-off or crepitus. Cardiovascular: Normal rate, regular rhythm. Grossly normal heart sounds.  Good peripheral circulation. Respiratory: Normal respiratory effort.  No retractions. Lungs CTAB. Gastrointestinal: Soft and nontender. No distention.  Genitourinary: No flank tenderness. Musculoskeletal: No thoracic or lumbar midline spinal tenderness.  No deformity to the extremities. Neurologic: Slurred speech.  Motor intact in all extremities.  No gross focal neurologic deficits are appreciated.  Skin:  Skin is warm and dry.  Erythema and induration to the right breast, right lateral chest wall and into the axilla.  No fluctuance, and no open wounds. Psychiatric: Somewhat flat affect.  ____________________________________________   LABS (all labs ordered are listed, but only abnormal results are displayed)  Labs Reviewed  COMPREHENSIVE METABOLIC PANEL - Abnormal; Notable for the following components:      Result Value   Sodium 132 (*)    Potassium 3.3 (*)    Chloride 92 (*)    Glucose, Bld 143 (*)    Calcium 8.3 (*)    Albumin 3.0 (*)    AST 77 (*)    ALT 55 (*)    Total Bilirubin 1.4 (*)    All other components within normal limits  CBC WITH DIFFERENTIAL/PLATELET - Abnormal; Notable for the following components:   WBC 37.4 (*)    Neutro Abs 31.8 (*)    Monocytes Absolute 4.1 (*)    All other components within normal limits  CULTURE, BLOOD (ROUTINE X 2)  CULTURE, BLOOD (ROUTINE X  2)  LACTIC ACID, PLASMA  URINALYSIS, COMPLETE (UACMP) WITH MICROSCOPIC  URINE DRUG SCREEN, QUALITATIVE ([facility] ONLY)  LACTIC ACID, PLASMA   ____________________________________________  EKG   ____________________________________________  RADIOLOGY  CT head: No ICH CT cervical spine: No acute fracture CXR: No focal infiltrate or other acute abnormality  ____________________________________________   PROCEDURES  Procedure(s) performed: No  Procedures  Critical Care performed: No ____________________________________________   INITIAL IMPRESSION / ASSESSMENT AND PLAN / ED COURSE  Pertinent labs & imaging results that were available during my care of  the patient were reviewed by me and considered in my medical decision making (see chart for details).  57 year old female with PMH as noted above presents after a fall either last night or this morning from standing height, as well as right breast and chest wall cellulitis.  I reviewed the past medical records in epic; the patient was seen by her family medicine provider at [facility] on 11/07/2017, and referred to surgical oncology and radiology for ultrasound the same day.  Ultrasound on 11/07/2017 showed soft tissue edema consistent with cellulitis and epidermal inclusion cyst but no abscess.  The patient was started on Bactrim.  On exam, the patient is somnolent but arousable with slurred speech consistent with intoxication.  I suspect that this is most likely due to her pain medication use as she denies other intoxicants.  The remainder of the exam is as described above.  There is mild tenderness to the cervical spine, and findings consistent with cellulitis to the right breast and chest wall.  For the fall and neck pain, we will obtain CT cervical spine, CT head given the patient's mental status, and chest x-ray.  For the right breast cellulitis we will obtain labs, lactate, blood cultures, and give a dose of IV clindamycin.  Given that  the patient has apparently failed Bactrim, she would likely benefit from admission for IV antibiotics.  ----------------------------------------- 11:02 PM on 11/11/2017 -----------------------------------------  The patient's imaging is negative.  I was able to clinically clear her from the c-collar as she has no midline tenderness at this time.  She is now much more alert.  Work-up reveals significantly elevated WBC count but is otherwise reassuring.  However given the fact that the patient has failed outpatient antibiotics, I will admit her for IV antibiotic's for the breast and chest wall cellulitis.  I discussed the results of the work-up and the plan of care with the patient and she agrees, and I signed the patient out to the hospitalist Dr. Jannifer Franklin.  ____________________________________________   FINAL CLINICAL IMPRESSION(S) / ED DIAGNOSES  Final diagnoses:  Cellulitis of right breast  Strain of neck muscle, initial encounter  Fall, initial encounter      NEW MEDICATIONS STARTED DURING THIS VISIT:  New Prescriptions   No medications on file     Note:  This document was prepared using Dragon voice recognition software and may include unintentional dictation errors.    Arta Silence, MD 11/11/17 2303

## 2017-11-11 NOTE — ED Notes (Signed)
ED Provider at bedside. 

## 2017-11-11 NOTE — ED Triage Notes (Signed)
Patient complains of fall either fall last night or this morning. Patient lives by herself. Patient had bilateral mastectomy last year and was seen by doctor last Wednesday for swollen lymph nodes and placed on antibiotics. Patient complained of neck pain when EMS asked and was placed in c-collar by EMS but patient was moving it around prior to that.

## 2017-11-12 ENCOUNTER — Inpatient Hospital Stay: Payer: Medicare Other

## 2017-11-12 ENCOUNTER — Other Ambulatory Visit: Payer: Self-pay

## 2017-11-12 DIAGNOSIS — E669 Obesity, unspecified: Secondary | ICD-10-CM | POA: Diagnosis present

## 2017-11-12 DIAGNOSIS — J45909 Unspecified asthma, uncomplicated: Secondary | ICD-10-CM | POA: Diagnosis present

## 2017-11-12 DIAGNOSIS — Z7951 Long term (current) use of inhaled steroids: Secondary | ICD-10-CM | POA: Diagnosis not present

## 2017-11-12 DIAGNOSIS — Z880 Allergy status to penicillin: Secondary | ICD-10-CM | POA: Diagnosis not present

## 2017-11-12 DIAGNOSIS — Z853 Personal history of malignant neoplasm of breast: Secondary | ICD-10-CM | POA: Diagnosis not present

## 2017-11-12 DIAGNOSIS — Z9013 Acquired absence of bilateral breasts and nipples: Secondary | ICD-10-CM | POA: Diagnosis not present

## 2017-11-12 DIAGNOSIS — F419 Anxiety disorder, unspecified: Secondary | ICD-10-CM | POA: Diagnosis present

## 2017-11-12 DIAGNOSIS — Z86718 Personal history of other venous thrombosis and embolism: Secondary | ICD-10-CM | POA: Diagnosis not present

## 2017-11-12 DIAGNOSIS — Z8249 Family history of ischemic heart disease and other diseases of the circulatory system: Secondary | ICD-10-CM | POA: Diagnosis not present

## 2017-11-12 DIAGNOSIS — N611 Abscess of the breast and nipple: Secondary | ICD-10-CM | POA: Diagnosis present

## 2017-11-12 DIAGNOSIS — T8579XA Infection and inflammatory reaction due to other internal prosthetic devices, implants and grafts, initial encounter: Secondary | ICD-10-CM | POA: Diagnosis present

## 2017-11-12 DIAGNOSIS — Y831 Surgical operation with implant of artificial internal device as the cause of abnormal reaction of the patient, or of later complication, without mention of misadventure at the time of the procedure: Secondary | ICD-10-CM | POA: Diagnosis present

## 2017-11-12 DIAGNOSIS — Z6832 Body mass index (BMI) 32.0-32.9, adult: Secondary | ICD-10-CM | POA: Diagnosis not present

## 2017-11-12 DIAGNOSIS — Z825 Family history of asthma and other chronic lower respiratory diseases: Secondary | ICD-10-CM | POA: Diagnosis not present

## 2017-11-12 DIAGNOSIS — Z9882 Breast implant status: Secondary | ICD-10-CM | POA: Diagnosis not present

## 2017-11-12 DIAGNOSIS — N61 Mastitis without abscess: Secondary | ICD-10-CM | POA: Diagnosis present

## 2017-11-12 DIAGNOSIS — F329 Major depressive disorder, single episode, unspecified: Secondary | ICD-10-CM | POA: Diagnosis present

## 2017-11-12 DIAGNOSIS — E871 Hypo-osmolality and hyponatremia: Secondary | ICD-10-CM | POA: Diagnosis present

## 2017-11-12 LAB — CBC
HCT: 39.1 % (ref 35.0–47.0)
Hemoglobin: 13.5 g/dL (ref 12.0–16.0)
MCH: 30.6 pg (ref 26.0–34.0)
MCHC: 34.7 g/dL (ref 32.0–36.0)
MCV: 88.3 fL (ref 80.0–100.0)
Platelets: 307 K/uL (ref 150–440)
RBC: 4.43 MIL/uL (ref 3.80–5.20)
RDW: 13.1 % (ref 11.5–14.5)
WBC: 35.3 K/uL — ABNORMAL HIGH (ref 3.6–11.0)

## 2017-11-12 LAB — BASIC METABOLIC PANEL
Anion gap: 13 (ref 5–15)
BUN: 11 mg/dL (ref 6–20)
CO2: 29 mmol/L (ref 22–32)
CREATININE: 0.84 mg/dL (ref 0.44–1.00)
Calcium: 8.2 mg/dL — ABNORMAL LOW (ref 8.9–10.3)
Chloride: 89 mmol/L — ABNORMAL LOW (ref 98–111)
GFR calc non Af Amer: >60 mL/min (ref >60)
Glucose, Bld: 135 mg/dL — ABNORMAL HIGH (ref 70–99)
POTASSIUM: 3.9 mmol/L (ref 3.5–5.1)
SODIUM: 131 mmol/L — AB (ref 135–145)

## 2017-11-12 LAB — C-REACTIVE PROTEIN: CRP: 36.2 mg/dL — AB (ref <1.0)

## 2017-11-12 MED ORDER — VANCOMYCIN HCL 10 G IV SOLR
1250.0000 mg | Freq: Two times a day (BID) | INTRAVENOUS | Status: DC
  Administered 2017-11-12: 1250 mg via INTRAVENOUS
  Filled 2017-11-12 (×2): qty 1250

## 2017-11-12 MED ORDER — ACETAMINOPHEN 650 MG RE SUPP: 650.0000 mg | Freq: Four times a day (QID) | RECTAL | Status: DC | PRN

## 2017-11-12 MED ORDER — ENOXAPARIN SODIUM 40 MG/0.4ML ~~LOC~~ SOLN: 40.0000 mg | SUBCUTANEOUS | Status: DC

## 2017-11-12 MED ORDER — IOHEXOL 300 MG/ML  SOLN
75.0000 mL | Freq: Once | INTRAMUSCULAR | Status: AC | PRN
  Administered 2017-11-12: 14:00:00 75 mL via INTRAVENOUS

## 2017-11-12 MED ORDER — ONDANSETRON HCL 4 MG PO TABS: 4.0000 mg | ORAL_TABLET | Freq: Four times a day (QID) | ORAL | Status: DC | PRN

## 2017-11-12 MED ORDER — POLYETHYLENE GLYCOL 3350 17 G PO PACK: 17.0000 g | PACK | Freq: Every day | ORAL | Status: DC | PRN

## 2017-11-12 MED ORDER — SODIUM CHLORIDE 0.9 % IV SOLN: 1.0000 g | Freq: Three times a day (TID) | INTRAVENOUS | Status: DC

## 2017-11-12 MED ORDER — ESCITALOPRAM OXALATE 10 MG PO TABS
20.0000 mg | ORAL_TABLET | Freq: Every day | ORAL | Status: DC
  Administered 2017-11-12: 08:00:00 20 mg via ORAL
  Filled 2017-11-12: qty 2

## 2017-11-12 MED ORDER — SODIUM CHLORIDE 0.9 % IV SOLN
1.0000 g | Freq: Three times a day (TID) | INTRAVENOUS | Status: DC
  Filled 2017-11-12 (×3): qty 1

## 2017-11-12 MED ORDER — SODIUM CHLORIDE 0.9 % IV SOLN
1.0000 g | Freq: Once | INTRAVENOUS | Status: AC
  Administered 2017-11-12: 1 g via INTRAVENOUS
  Filled 2017-11-12: qty 1

## 2017-11-12 MED ORDER — MORPHINE SULFATE 15 MG PO TABS
30.0000 mg | ORAL_TABLET | Freq: Two times a day (BID) | ORAL | Status: DC | PRN
  Administered 2017-11-12: 30 mg via ORAL
  Filled 2017-11-12: qty 2

## 2017-11-12 MED ORDER — MOMETASONE FURO-FORMOTEROL FUM 200-5 MCG/ACT IN AERO
2.0000 | INHALATION_SPRAY | Freq: Two times a day (BID) | RESPIRATORY_TRACT | Status: DC
  Administered 2017-11-12: 08:00:00 2 via RESPIRATORY_TRACT
  Filled 2017-11-12: qty 8.8

## 2017-11-12 MED ORDER — ONDANSETRON HCL 4 MG/2ML IJ SOLN: 4.0000 mg | Freq: Four times a day (QID) | INTRAMUSCULAR | Status: DC | PRN

## 2017-11-12 MED ORDER — MORPHINE SULFATE (PF) 4 MG/ML IV SOLN
4.0000 mg | INTRAVENOUS | Status: DC | PRN
  Administered 2017-11-12: 4 mg via INTRAVENOUS
  Filled 2017-11-12: qty 1

## 2017-11-12 MED ORDER — SODIUM CHLORIDE 0.9 % IV SOLN
INTRAVENOUS | Status: DC
  Administered 2017-11-12: 16:00:00 via INTRAVENOUS

## 2017-11-12 MED ORDER — MONTELUKAST SODIUM 10 MG PO TABS
10.0000 mg | ORAL_TABLET | Freq: Every day | ORAL | Status: DC
  Administered 2017-11-12: 11:00:00 10 mg via ORAL
  Filled 2017-11-12: qty 1

## 2017-11-12 MED ORDER — ACETAMINOPHEN 325 MG PO TABS: 650.0000 mg | ORAL_TABLET | Freq: Four times a day (QID) | ORAL | Status: DC | PRN

## 2017-11-12 MED ORDER — CLINDAMYCIN PHOSPHATE 600 MG/50ML IV SOLN: 600.0000 mg | Freq: Three times a day (TID) | INTRAVENOUS | Status: DC

## 2017-11-12 MED ORDER — VANCOMYCIN HCL 10 G IV SOLR
1250.0000 mg | Freq: Once | INTRAVENOUS | Status: AC
  Administered 2017-11-12: 11:00:00 1250 mg via INTRAVENOUS
  Filled 2017-11-12: qty 1250

## 2017-11-12 MED ORDER — TIOTROPIUM BROMIDE MONOHYDRATE 18 MCG IN CAPS
18.0000 ug | ORAL_CAPSULE | Freq: Every day | RESPIRATORY_TRACT | Status: DC
  Administered 2017-11-12: 18 ug via RESPIRATORY_TRACT
  Filled 2017-11-12: qty 5

## 2017-11-12 MED ORDER — PREGABALIN 75 MG PO CAPS
200.0000 mg | ORAL_CAPSULE | Freq: Three times a day (TID) | ORAL | Status: DC
  Administered 2017-11-12 (×2): 200 mg via ORAL
  Filled 2017-11-12 (×2): qty 1

## 2017-11-12 MED ORDER — DULOXETINE HCL 30 MG PO CPEP: 60.0000 mg | ORAL_CAPSULE | Freq: Every day | ORAL | Status: DC

## 2017-11-12 MED ORDER — OXYCODONE-ACETAMINOPHEN 5-325 MG PO TABS
1.0000 | ORAL_TABLET | ORAL | Status: DC | PRN
  Administered 2017-11-12: 20:00:00 2 via ORAL
  Administered 2017-11-12: 11:00:00 1 via ORAL
  Filled 2017-11-12: qty 2
  Filled 2017-11-12: qty 1

## 2017-11-12 MED ORDER — DOCUSATE SODIUM 100 MG PO CAPS: 100.0000 mg | ORAL_CAPSULE | Freq: Two times a day (BID) | ORAL | Status: DC

## 2017-11-12 MED ORDER — SODIUM CHLORIDE 0.9 % IV SOLN: 1250.0000 mg | Freq: Two times a day (BID) | INTRAVENOUS | Status: DC

## 2017-11-12 MED ORDER — MORPHINE SULFATE ER 15 MG PO TBCR
60.0000 mg | EXTENDED_RELEASE_TABLET | Freq: Three times a day (TID) | ORAL | Status: DC
  Administered 2017-11-12 (×2): 60 mg via ORAL
  Filled 2017-11-12 (×2): qty 4

## 2017-11-12 NOTE — H&P (Signed)
[facility] [place] NAME: Rebecca Colon    MR#:  [id number]  DATE OF BIRTH:  Jun 12, 1960  DATE OF ADMISSION:  11/11/2017  PRIMARY CARE PHYSICIAN: Charlann Boxer, MD   REQUESTING/REFERRING PHYSICIAN: Cherylann Banas, MD  CHIEF COMPLAINT:   Chief Complaint  Patient presents with  . Fall    HISTORY OF PRESENT ILLNESS:  Rebecca Colon  is a 57 y.o. female who presents with chief complaint as above.  Patient developed cellulitis of her right breast and axilla.  She was seen in the outpatient setting and placed on Bactrim, but her cellulitis has failed to improve and has gotten worse.  Her white blood cell count is significantly elevated, though she does not meet sepsis criteria.  Hospitalist were called for admission and IV antibiotics.  PAST MEDICAL HISTORY:   Past Medical History:  Diagnosis Date  . Anemia   . Anxiety   . Asthma   . 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 Depression   . DVT (deep venous thrombosis) ([place])   . Sciatic nerve injury      PAST SURGICAL HISTORY:   Past Surgical History:  Procedure Laterality Date  . CERVICAL FUSION    . FASCIOTOMY Left   . LUMBAR FUSION    . MASTECTOMY    . SPINAL CORD STIMULATOR IMPLANT    . TONSILLECTOMY       SOCIAL HISTORY:   Social History   Tobacco Use  . Smoking status: Never Smoker  . Smokeless tobacco: Never Used  Substance Use Topics  . Alcohol use: No     FAMILY HISTORY:   Family History  Problem Relation Age of Onset  . COPD Mother   . Heart disease Mother   . Hypertension Mother   . COPD Father   . Heart disease Father   . Hypertension Father      DRUG ALLERGIES:   Allergies  Allergen Reactions  . Penicillins Anaphylaxis and Other (See Comments)    **Patient received ceftriaxone 1g IV in ED on 07/08/17 w/o ADR Has patient had a PCN reaction causing immediate rash, facial/tongue/throat swelling, SOB or lightheadedness with  hypotension: Yes Has patient had a PCN reaction causing severe rash involving mucus membranes or skin necrosis: Unknown Has patient had a PCN reaction that required hospitalization: Yes Has patient had a PCN reaction occurring within the last 10 years: No If all above answers are "NO", then may proceed with Cephalosporin use.    MEDICATIONS AT HOME:   Prior to Admission medications   Medication Sig Start Date End Date Taking? Authorizing Provider  ADVAIR DISKUS 500-50 MCG/DOSE AEPB Inhale 1 puff into the lungs 2 (two) times daily. 09/26/17  Yes [provider]  albuterol (PROVENTIL HFA;VENTOLIN HFA) 108 (90 Base) MCG/ACT inhaler Inhale 2 puffs into the lungs every 6 (six) hours as needed for wheezing or shortness of breath.   Yes [provider]  baclofen (LIORESAL) 20 MG tablet Take 20 mg by mouth every 8 (eight) hours as needed for muscle spasms.    Yes [provider]  DULoxetine (CYMBALTA) 60 MG capsule Take 60 mg by mouth at bedtime.   Yes [provider]  escitalopram (LEXAPRO) 20 MG tablet Take 20 mg by mouth daily.   Yes [provider]  montelukast (SINGULAIR) 10 MG tablet Take 10 mg by mouth daily.   Yes [provider]  morphine (MS CONTIN) 60 MG 12 hr tablet  Take 60 mg by mouth every 8 (eight) hours.   Yes [provider]  morphine (MSIR) 30 MG tablet Take 30 mg by mouth 2 (two) times daily as needed for severe pain.   Yes [provider]  MOVANTIK 25 MG TABS tablet Take 25 mg by mouth daily. 10/19/17  Yes [provider]  pregabalin (LYRICA) 200 MG capsule Take 200 mg by mouth 3 (three) times daily.   Yes [provider]  sulfamethoxazole-trimethoprim (BACTRIM DS,SEPTRA DS) 800-160 MG tablet Take 1 tablet by mouth 2 (two) times daily. 11/07/17 11/17/17 Yes [provider]  tiotropium (SPIRIVA) 18 MCG inhalation capsule Place 18 mcg into inhaler and inhale daily.   Yes [provider]  docusate sodium (COLACE) 100 MG capsule Take 1 capsule (100 mg total) by mouth 2 (two) times daily. Patient not taking: Reported on 11/11/2017 07/10/17   Gladstone Lighter, MD  sulfamethoxazole-trimethoprim (BACTRIM DS,SEPTRA DS) 800-160 MG tablet Take 1 tablet by mouth every 12 (twelve) hours. X 10 days Patient not taking: Reported on 11/11/2017 07/10/17   Gladstone Lighter, MD    REVIEW OF SYSTEMS:  Review of Systems  Constitutional: Negative for chills, fever, malaise/fatigue and weight loss.  HENT: Negative for ear pain, hearing loss and tinnitus.   Eyes: Negative for blurred vision, double vision, pain and redness.  Respiratory: Negative for cough, hemoptysis and shortness of breath.   Cardiovascular: Negative for chest pain, palpitations, orthopnea and leg swelling.  Gastrointestinal: Negative for abdominal pain, constipation, diarrhea, nausea and vomiting.  Genitourinary: Negative for dysuria, frequency and hematuria.  Musculoskeletal: Negative for back pain, joint pain and neck pain.  Skin:       Right breast and right axilla erythema and tenderness  Neurological: Negative for dizziness, tremors, focal weakness and weakness.  Endo/Heme/Allergies: Negative for polydipsia. Does not bruise/bleed easily.  Psychiatric/Behavioral: Negative for depression. The patient is not nervous/anxious and does not have insomnia.      VITAL SIGNS:   Vitals:   11/11/17 2134 11/11/17 2230  BP: 134/89 131/74  Pulse: 98 94  Resp: 18 18  Temp: 98.4 F (36.9 C)   TempSrc: Oral   SpO2: 94% 95%   Wt Readings from Last 3 Encounters:  07/08/17 93 kg  12/14/15 90.7 kg    PHYSICAL EXAMINATION:  Physical Exam  Vitals reviewed. Constitutional: She is oriented to person, place, and time. She appears well-developed and well-nourished. No distress.  HENT:  Head: Normocephalic and atraumatic.  Mouth/Throat: Oropharynx is clear and moist.  Eyes: Pupils are equal, round, and reactive to light.  Conjunctivae and EOM are normal. No scleral icterus.  Neck: Normal range of motion. Neck supple. No JVD present. No thyromegaly present.  Cardiovascular: Normal rate, regular rhythm and intact distal pulses. Exam reveals no gallop and no friction rub.  No murmur heard. Respiratory: Effort normal and breath sounds normal. No respiratory distress. She has no wheezes. She has no rales.  GI: Soft. Bowel sounds are normal. She exhibits no distension. There is no tenderness.  Musculoskeletal: Normal range of motion. She exhibits no edema.  No arthritis, no gout  Lymphadenopathy:    She has no cervical adenopathy.  Neurological: She is alert and oriented to person, place, and time. No cranial nerve deficit.  No dysarthria, no aphasia  Skin: Skin is warm and dry. No rash noted. There is erythema (Right breast and right axilla).  Psychiatric: She has a normal mood and affect. Her behavior is normal. Judgment and thought  content normal.    LABORATORY PANEL:   CBC Recent Labs  Lab 11/11/17 2141  WBC 37.4*  HGB 14.7  HCT 41.1  PLT 324   ------------------------------------------------------------------------------------------------------------------  Chemistries  Recent Labs  Lab 11/11/17 2141  NA 132*  K 3.3*  CL 92*  CO2 25  GLUCOSE 143*  BUN 10  CREATININE 0.94  CALCIUM 8.3*  AST 77*  ALT 55*  ALKPHOS 108  BILITOT 1.4*   ------------------------------------------------------------------------------------------------------------------  Cardiac Enzymes No results for input(s): TROPONINI in the last 168 hours. ------------------------------------------------------------------------------------------------------------------  RADIOLOGY:  Ct Head Wo Contrast  Result Date: 11/11/2017 CLINICAL DATA:  57 y/o  F; fall with neck pain and confusion. EXAM: CT HEAD WITHOUT CONTRAST CT CERVICAL SPINE WITHOUT CONTRAST TECHNIQUE: Multidetector CT imaging of the head and cervical spine  was performed following the standard protocol without intravenous contrast. Multiplanar CT image reconstructions of the cervical spine were also generated. COMPARISON:  07/07/2017 CT head. FINDINGS: CT HEAD FINDINGS Brain: No evidence of acute infarction, hemorrhage, hydrocephalus, extra-axial collection or mass lesion/mass effect. Vascular: No hyperdense vessel or unexpected calcification. Skull: Normal. Negative for fracture or focal lesion. Sinuses/Orbits: Trace opacification of the right mastoid tip. Small sphenoid sinus fluid level. Orbits are unremarkable. Other: None. CT CERVICAL SPINE FINDINGS Alignment: Mild reversal of cervical curvature. Skull base and vertebrae: No acute fracture. No primary bone lesion or focal pathologic process. C5-6 ACDF. Hardware is intact and there is no apparent hardware related complication. Soft tissues and spinal canal: No prevertebral fluid or swelling. No visible canal hematoma. Disc levels: Mild cervical spondylosis with discogenic degenerative changes predominantly C3-C5 and C6-7 as well as prominent left-sided C4-5 facet arthrosis. No high-grade bony foraminal stenosis. Mild C5-7 canal stenosis. Upper chest: Negative. Other: Negative. IMPRESSION: 1. No acute intracranial abnormality or calvarial fracture. Unremarkable CT of the head. 2. No acute fracture or dislocation of the cervical spine. 3. Mild cervical spondylosis.  No high-grade bony canal stenosis. Electronically Signed   By: Kristine Garbe M.D.   On: 11/11/2017 22:31   Ct Cervical Spine Wo Contrast  Result Date: 11/11/2017 CLINICAL DATA:  58 y/o  F; fall with neck pain and confusion. EXAM: CT HEAD WITHOUT CONTRAST CT CERVICAL SPINE WITHOUT CONTRAST TECHNIQUE: Multidetector CT imaging of the head and cervical spine was performed following the standard protocol without intravenous contrast. Multiplanar CT image reconstructions of the cervical spine were also generated. COMPARISON:  07/07/2017 CT head.  FINDINGS: CT HEAD FINDINGS Brain: No evidence of acute infarction, hemorrhage, hydrocephalus, extra-axial collection or mass lesion/mass effect. Vascular: No hyperdense vessel or unexpected calcification. Skull: Normal. Negative for fracture or focal lesion. Sinuses/Orbits: Trace opacification of the right mastoid tip. Small sphenoid sinus fluid level. Orbits are unremarkable. Other: None. CT CERVICAL SPINE FINDINGS Alignment: Mild reversal of cervical curvature. Skull base and vertebrae: No acute fracture. No primary bone lesion or focal pathologic process. C5-6 ACDF. Hardware is intact and there is no apparent hardware related complication. Soft tissues and spinal canal: No prevertebral fluid or swelling. No visible canal hematoma. Disc levels: Mild cervical spondylosis with discogenic degenerative changes predominantly C3-C5 and C6-7 as well as prominent left-sided C4-5 facet arthrosis. No high-grade bony foraminal stenosis. Mild C5-7 canal stenosis. Upper chest: Negative. Other: Negative. IMPRESSION: 1. No acute intracranial abnormality or calvarial fracture. Unremarkable CT of the head. 2. No acute fracture or dislocation of the cervical spine. 3. Mild cervical spondylosis.  No high-grade bony canal stenosis. Electronically Signed   By: Edgardo Roys.D.  On: 11/11/2017 22:31   Dg Chest Portable 1 View  Result Date: 11/11/2017 CLINICAL DATA:  57 y/o  F; chest pain after fall. EXAM: PORTABLE CHEST 1 VIEW COMPARISON:  08/24/2009 chest radiographs FINDINGS: Normal cardiac silhouette. The partially visualized anterior cervical fusion hardware and spinal stimulator leads are stable. Surgical clips project over left hilum. Clear lungs. No pleural effusion or pneumothorax. No acute osseous abnormality is evident. Mild reverse S curvature of the spine. IMPRESSION: No acute process identified. Electronically Signed   By: Kristine Garbe M.D.   On: 11/11/2017 22:53    EKG:  No orders found  for this or any previous visit.  IMPRESSION AND PLAN:  Principal Problem:   Cellulitis -failed outpatient Bactrim, IV clindamycin started here, PRN analgesia Active Problems:   Anxiety -Home dose anxiolytic   Depression -home dose antidepressant  Chart review performed and case discussed with ED provider. Labs, imaging and/or ECG reviewed by provider and discussed with patient/family. Management plans discussed with the patient and/or family.  DVT PROPHYLAXIS: SubQ lovenox   GI PROPHYLAXIS:  None  ADMISSION STATUS:   Observation  CODE STATUS: Full Code Status History    Date Active Date Inactive Code Status Order ID Comments User Context   07/08/2017 0223 07/10/2017 1530 Full Code [id number]  Lance Coon, MD Inpatient      TOTAL TIME TAKING CARE OF THIS PATIENT: 40 minutes.   Brid[facility] [place] 11/12/2017, 12:44 AM  [facility]  Office  [phone]  CC: Primary care physician; Charlann Boxer, MD  Note:  This document was prepared using Dragon voice recognition software and may include unintentional dictation errors.

## 2017-11-12 NOTE — Consult Note (Addendum)
Subjective:   CC: right axilla/breast cellulitis  HPI:  Rebecca Colon is a 57 y.o. female who was consulted by Berneta Sages, for evaluation of right axilla breast cellulitis.  Patient initially noticed to pimple-like structures in her right axilla almost 2 weeks ago that gradually became more painful and erythematous.  She was initially prescribed outpatient antibiotics.  Her symptoms continue to worsen so she was admitted in the hospital for IV antibiotics.  She states that the pain and erythema has now spread to her right breast.  She she states she also developed a fever of 102.  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  Past Surgical History:  has a past surgical history that includes Tonsillectomy; Cervical fusion; Lumbar fusion; Spinal cord stimulator implant; Fasciotomy (Left); and Mastectomy.  Family History: family history includes COPD in her father and mother; Heart disease in her father and mother; Hypertension in her father and mother.  Social History:  reports that she quit smoking about 5 years ago. She has never used smokeless tobacco. She reports that she does not drink alcohol or use drugs.  Current Medications:  Medications Prior to Admission  Medication Sig Dispense Refill  . ADVAIR DISKUS 500-50 MCG/DOSE AEPB Inhale 1 puff into the lungs 2 (two) times daily.  6  . albuterol (PROVENTIL HFA;VENTOLIN HFA) 108 (90 Base) MCG/ACT inhaler Inhale 2 puffs into the lungs every 6 (six) hours as needed for wheezing or shortness of breath.    . baclofen (LIORESAL) 20 MG tablet Take 20 mg by mouth every 8 (eight) hours as needed for muscle spasms.     . DULoxetine (CYMBALTA) 60 MG capsule Take 60 mg by mouth at bedtime.    Marland Kitchen escitalopram (LEXAPRO) 20 MG tablet Take 20 mg by mouth daily.    . montelukast  (SINGULAIR) 10 MG tablet Take 10 mg by mouth daily.    Marland Kitchen morphine (MS CONTIN) 60 MG 12 hr tablet Take 60 mg by mouth every 8 (eight) hours.    Marland Kitchen morphine (MSIR) 30 MG tablet Take 30 mg by mouth 2 (two) times daily as needed for severe pain.    Marland Kitchen MOVANTIK 25 MG TABS tablet Take 25 mg by mouth daily.  1  . pregabalin (LYRICA) 200 MG capsule Take 200 mg by mouth 3 (three) times daily.    Marland Kitchen sulfamethoxazole-trimethoprim (BACTRIM DS,SEPTRA DS) 800-160 MG tablet Take 1 tablet by mouth 2 (two) times daily.    Marland Kitchen tiotropium (SPIRIVA) 18 MCG inhalation capsule Place 18 mcg into inhaler and inhale daily.    Marland Kitchen docusate sodium (COLACE) 100 MG capsule Take 1 capsule (100 mg total) by mouth 2 (two) times daily. (Patient not taking: Reported on 11/11/2017) 10 capsule 0  . sulfamethoxazole-trimethoprim (BACTRIM DS,SEPTRA DS) 800-160 MG tablet Take 1 tablet by mouth every 12 (twelve) hours. X 10 days (Patient not taking: Reported on 11/11/2017) 20 tablet 0    Allergies:  Allergies  Allergen Reactions  . Penicillins Anaphylaxis and Other (See Comments)    **Patient received ceftriaxone 1g IV in ED on 07/08/17 w/o ADR Has patient had a PCN reaction causing immediate rash, facial/tongue/throat swelling, SOB or lightheadedness with hypotension: Yes Has patient had a PCN reaction causing severe rash involving mucus membranes or skin necrosis: Unknown Has patient had a PCN reaction that required hospitalization: Yes Has patient  had a PCN reaction occurring within the last 10 years: No If all above answers are "NO", then may proceed with Cephalosporin use.    ROS:  A 15 point review of systems was performed and pertinent positives and negatives noted in HPI   Objective:     BP (!) 114/59 (BP Location: Left Arm)   Pulse 84   Temp 98.7 F (37.1 C) (Oral)   Resp 14   Ht 5\' 6"  (1.676 m)   Wt 91.4 kg   SpO2 97%   BMI 32.51 kg/m   Constitutional :  alert, cooperative, appears stated age and no distress   Lymphatics/Throat:  no asymmetry, masses, or scars  Respiratory:  clear to auscultation bilaterally  Cardiovascular:  regular rate and rhythm  Gastrointestinal: soft, non-tender; bowel sounds normal; no masses,  no organomegaly.  Musculoskeletal: Steady gait and movement  Skin: Cool and moist, extensive erythema extending from right axilla toward posterior axillary line and anterior to entire right breast, with tenderness throughout.  Some induration noted in the right breast region and two areas in axilla, potentially where initial boils noted.  No obvious discharge on exam.  Psychiatric: Normal affect, non-agitated, not confused       LABS:  CMP Latest Ref Rng & Units 11/12/2017 11/11/2017 07/08/2017  Glucose 70 - 99 mg/dL 135(H) 143(H) 108(H)  BUN 6 - 20 mg/dL 11 10 17   Creatinine 0.44 - 1.00 mg/dL 0.84 0.94 0.70  Sodium 135 - 145 mmol/L 131(L) 132(L) 138  Potassium 3.5 - 5.1 mmol/L 3.9 3.3(L) 3.6  Chloride 98 - 111 mmol/L 89(L) 92(L) 102  CO2 22 - 32 mmol/L 29 25 26   Calcium 8.9 - 10.3 mg/dL 8.2(L) 8.3(L) 9.4  Total Protein 6.5 - 8.1 g/dL - 7.3 -  Total Bilirubin 0.3 - 1.2 mg/dL - 1.4(H) -  Alkaline Phos 38 - 126 U/L - 108 -  AST 15 - 41 U/L - 77(H) -  ALT 0 - 44 U/L - 55(H) -   CBC Latest Ref Rng & Units 11/12/2017 11/11/2017 07/08/2017  WBC 3.6 - 11.0 K/uL 35.3(H) 37.4(H) 10.1  Hemoglobin 12.0 - 16.0 g/dL 13.5 14.7 15.6  Hematocrit 35.0 - 47.0 % 39.1 41.1 44.6  Platelets 150 - 440 K/uL 307 324 263    RADS: Study Result   CLINICAL DATA:  Right chest wall cellulitis. The patient appears to have had bilateral mastectomies with bilateral subpectoral breast implants.  EXAM: CT CHEST WITH CONTRAST  TECHNIQUE: Multidetector CT imaging of the chest was performed during intravenous contrast administration.  CONTRAST:  28mL OMNIPAQUE IOHEXOL 300 MG/ML  SOLN  COMPARISON:  None.  FINDINGS: Cardiovascular: No significant vascular findings. Normal heart size. No pericardial  effusion.  Mediastinum/Nodes: No enlarged mediastinal, hilar, or axillary lymph nodes. Thyroid gland, trachea, and esophagus demonstrate no significant findings.  Lungs/Pleura: Lungs are clear. No pleural effusion or pneumothorax.  Upper Abdomen: No acute abnormality.  Musculoskeletal: There is a prominent fluid collection surrounding the right breast implant as well as edema in the adjacent subcutaneous fat. There is abnormal soft tissue stranding in the right axilla without definable adenopathy. This could represent areas of cellulitis of the possibility of this representing tumor should also be considered. These irregular areas of soft tissue density extends around the right axillary artery and vein and right brachial plexus. Subcutaneous edema extends into the lateral right chest wall below the reconstructed breast and along the muscles of the right lateral chest wall.  The right breast implant contour  is slightly irregular as compared to the left breast implant contour but overall size of the implant is essentially the same.  IMPRESSION: 1. Extensive fluid around the right breast implant with edema in the adjacent soft tissues. This could represent an abscess around the implant. 2. Abnormal irregular soft tissue density in the right axilla and right lateral chest wall. This could represent focal areas of cellulitis but I cannot exclude tumor at this site. There is no peripheral enhancement to suggest abscess in the axillary soft tissues. 3. No significant adenopathy. Several small right axillary lymph nodes, too small to characterize.   Electronically Signed   By: Lorriane Shire M.D.   On: 11/12/2017 14:12     Assessment:     Right axillary and breast cellulitis with underlying right breast implant infection/abscess   Plan:     1 upon initial exam I became concerned for a possible implant infection so a CT scan was ordered.  Subsequent results did  indeed show a fluid collection around the implant concerning for an abscess.  At this point I contacted the plastic surgeon I did the original procedure in [facility] and he has agreed to take over the patient's care as an accepting physician and requested transfer of care this was passed along to Dr. Bridgett Larsson the hospitalist and all are in agreement patient is currently in the process of being transferred

## 2017-11-12 NOTE — Progress Notes (Signed)
[facility] transport here to pick pt up.  All belongings with patient (shirt, panties, cell phone).  She was transported in a hospital gown.  Pain medication on board for transport.

## 2017-11-12 NOTE — Progress Notes (Signed)
Pharmacy Antibiotic Note  Rebecca Colon is a 57 y.o. female admitted on 11/11/2017 with cellulitis. Patient previously on Bactrim PTA with no resolution of cellulitis. Initially started on clindamycin on admission. Pharmacy has been consulted for vancomycin and meropenem dosing.  Plan: Vancomycin 1250 mg IV q12h to start today @ 1700. Goal trough 15-20 mcg/ml. Will order trough for 8/13 @ 1630. Meropenem 1 g IV q8h  Height: 5\' 6"  (167.6 cm) Weight: 201 lb 6.4 oz (91.4 kg) IBW/kg (Calculated) : 59.3  Temp (24hrs), Avg:98.7 F (37.1 C), Min:98.4 F (36.9 C), Max:98.9 F (37.2 C)  Recent Labs  Lab 11/11/17 2141 11/12/17 0410  WBC 37.4* 35.3*  CREATININE 0.94 0.84  LATICACIDVEN 1.8  --     Estimated Creatinine Clearance: 87.1 mL/min (by C-G formula based on SCr of 0.84 mg/dL).    Allergies  Allergen Reactions  . Penicillins Anaphylaxis and Other (See Comments)    **Patient received ceftriaxone 1g IV in ED on 07/08/17 w/o ADR Has patient had a PCN reaction causing immediate rash, facial/tongue/throat swelling, SOB or lightheadedness with hypotension: Yes Has patient had a PCN reaction causing severe rash involving mucus membranes or skin necrosis: Unknown Has patient had a PCN reaction that required hospitalization: Yes Has patient had a PCN reaction occurring within the last 10 years: No If all above answers are "NO", then may proceed with Cephalosporin use.    Antimicrobials this admission: Clindamycin 8/11 >> 8/12 Vanc 8/12 >>  Meropenem 8/12 >>   Dose adjustments this admission:   Microbiology results: 8/11 BCx: NGTD 8/11 UCx: sent   Thank you for allowing pharmacy to be a part of this patient's care.  Tawnya Crook, PharmD Pharmacy Resident  11/12/2017 10:22 AM

## 2017-11-12 NOTE — Discharge Summary (Signed)
Rebecca Colon at [place] NAME: Rebecca Colon    MR#:  [id number]  DATE OF BIRTH:  Jun 28, 1960  DATE OF ADMISSION:  11/11/2017   ADMITTING PHYSICIAN: Lance Coon, MD  DATE OF DISCHARGE: 11/12/2017 PRIMARY CARE PHYSICIAN: Charlann Boxer, MD   ADMISSION DIAGNOSIS:  Strain of neck muscle, initial encounter [S16.1XXA] Fall, initial encounter [id number] Cellulitis of right breast [N61.0] DISCHARGE DIAGNOSIS:  Principal Problem:   Cellulitis Active Problems:   Anxiety   Depression   Cellulitis of breast  SECONDARY DIAGNOSIS:   Past Medical History:  Diagnosis Date  . Anemia   . Anxiety   . Asthma   . Breast cancer ([place])   . 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 Depression   . DVT (deep venous thrombosis) ([place])   . Sciatic nerve injury    HOSPITAL COURSE:  Cellulitis and possible abscess of right breast with leukocytosis. She failed outpatient Bactrim, is on IV clindamycin since admission,PRN analgesia Changed to meropenem and vancomycin pharmacy to dose. CT of the chest report that possible abscess around breast implant.   Follow-up CBC. Per Dr. Richardine Service, the patient did needs surgery. Dr. Lysle Pearl discussed with Dr. Fuller Plan, [facility] plastic surgeon, who accepted the patient. Keep n.p.o. for surgery today.  Hyponatremia.  Normal saline IV and follow-up BMP.  History of right breast cancer, status post mastectomy and implant.  Follow-up oncologist as outpatient. Anxiety -Home dose anxiolytic Depression -home dose antidepressant Discussed with Dr. Lysle Pearl. DISCHARGE CONDITIONS:  The patient will be transferred to [facility] hospital. CONSULTS OBTAINED:  Treatment Team:  Benjamine Sprague, DO DRUG ALLERGIES:   Allergies  Allergen Reactions  . Penicillins Anaphylaxis and Other (See Comments)    **Patient received ceftriaxone 1g IV in ED on 07/08/17 w/o ADR Has patient had a PCN reaction causing immediate rash,  facial/tongue/throat swelling, SOB or lightheadedness with hypotension: Yes Has patient had a PCN reaction causing severe rash involving mucus membranes or skin necrosis: Unknown Has patient had a PCN reaction that required hospitalization: Yes Has patient had a PCN reaction occurring within the last 10 years: No If all above answers are "NO", then may proceed with Cephalosporin use.   DISCHARGE MEDICATIONS:   Allergies as of 11/12/2017      Reactions   Penicillins Anaphylaxis, Other (See Comments)   **Patient received ceftriaxone 1g IV in ED on 07/08/17 w/o ADR Has patient had a PCN reaction causing immediate rash, facial/tongue/throat swelling, SOB or lightheadedness with hypotension: Yes Has patient had a PCN reaction causing severe rash involving mucus membranes or skin necrosis: Unknown Has patient had a PCN reaction that required hospitalization: Yes Has patient had a PCN reaction occurring within the last 10 years: No If all above answers are "NO", then may proceed with Cephalosporin use.      Medication List    STOP taking these medications   sulfamethoxazole-trimethoprim 800-160 MG tablet Commonly known as:  BACTRIM DS,SEPTRA DS     TAKE these medications   ADVAIR DISKUS 500-50 MCG/DOSE Aepb Generic drug:  Fluticasone-Salmeterol Inhale 1 puff into the lungs 2 (two) times daily.   albuterol 108 (90 Base) MCG/ACT inhaler Commonly known as:  PROVENTIL HFA;VENTOLIN HFA Inhale 2 puffs into the lungs every 6 (six) hours as needed for wheezing or shortness of breath.   baclofen 20 MG tablet Commonly known as:  LIORESAL Take 20 mg by mouth every 8 (eight) hours as needed for muscle spasms.   docusate  sodium 100 MG capsule Commonly known as:  COLACE Take 1 capsule (100 mg total) by mouth 2 (two) times daily.   DULoxetine 60 MG capsule Commonly known as:  CYMBALTA Take 60 mg by mouth at bedtime.   escitalopram 20 MG tablet Commonly known as:  LEXAPRO Take 20 mg by mouth  daily.   meropenem 1 g in sodium chloride 0.9 % 100 mL Inject 1 g into the vein every 8 (eight) hours.   montelukast 10 MG tablet Commonly known as:  SINGULAIR Take 10 mg by mouth daily.   morphine 30 MG tablet Commonly known as:  MSIR Take 30 mg by mouth 2 (two) times daily as needed for severe pain.   morphine 60 MG 12 hr tablet Commonly known as:  MS CONTIN Take 60 mg by mouth every 8 (eight) hours.   MOVANTIK 25 MG Tabs tablet Generic drug:  naloxegol oxalate Take 25 mg by mouth daily.   pregabalin 200 MG capsule Commonly known as:  LYRICA Take 200 mg by mouth 3 (three) times daily.   tiotropium 18 MCG inhalation capsule Commonly known as:  SPIRIVA Place 18 mcg into inhaler and inhale daily.   vancomycin 1,250 mg in sodium chloride 0.9 % 250 mL Inject 1,250 mg into the vein every 12 (twelve) hours.        DISCHARGE INSTRUCTIONS:  See AVS. If you experience worsening of your admission symptoms, develop shortness of breath, life threatening emergency, suicidal or homicidal thoughts you must seek medical attention immediately by calling 911 or calling your MD immediately  if symptoms less severe.  You Must read complete instructions/literature along with all the possible adverse reactions/side effects for all the Medicines you take and that have been prescribed to you. Take any new Medicines after you have completely understood and accpet all the possible adverse reactions/side effects.   Please note  You were cared for by a hospitalist during your hospital stay. If you have any questions about your discharge medications or the care you received while you were in the hospital after you are discharged, you can call the unit and asked to speak with the hospitalist on call if the hospitalist that took care of you is not available. Once you are discharged, your primary care physician will handle any further medical issues. Please note that NO REFILLS for any discharge  medications will be authorized once you are discharged, as it is imperative that you return to your primary care physician (or establish a relationship with a primary care physician if you do not have one) for your aftercare needs so that they can reassess your need for medications and monitor your lab values.    On the day of Discharge:  VITAL SIGNS:  Blood pressure (!) 114/59, pulse 84, temperature 98.7 F (37.1 C), temperature source Oral, resp. rate 14, height 5\' 6"  (1.676 m), weight 91.4 kg, SpO2 97 %. PHYSICAL EXAMINATION:  GENERAL:  57 y.o.-year-old patient lying in the bed with no acute distress.  EYES: Pupils equal, round, reactive to light and accommodation. No scleral icterus. Extraocular muscles intact.  HEENT: Head atraumatic, normocephalic. Oropharynx and nasopharynx clear.  NECK:  Supple, no jugular venous distention. No thyroid enlargement, no tenderness.  LUNGS: Normal breath sounds bilaterally, no wheezing, rales,rhonchi or crepitation. No use of accessory muscles of respiration.  CARDIOVASCULAR: S1, S2 normal. No murmurs, rubs, or gallops.  ABDOMEN: Soft, non-tender, non-distended. Bowel sounds present. No organomegaly or mass.  EXTREMITIES: No pedal edema, cyanosis, or  clubbing.  NEUROLOGIC: Cranial nerves II through XII are intact. Muscle strength 5/5 in all extremities. Sensation intact. Gait not checked.  PSYCHIATRIC: The patient is alert and oriented x 3.  SKIN: No obvious rash, lesion, or ulcer.   Erythema, swelling and tenderness on the right breast.  DATA REVIEW:   CBC Recent Labs  Lab 11/12/17 0410  WBC 35.3*  HGB 13.5  HCT 39.1  PLT 307    Chemistries  Recent Labs  Lab 11/11/17 2141 11/12/17 0410  NA 132* 131*  K 3.3* 3.9  CL 92* 89*  CO2 25 29  GLUCOSE 143* 135*  BUN 10 11  CREATININE 0.94 0.84  CALCIUM 8.3* 8.2*  AST 77*  --   ALT 55*  --   ALKPHOS 108  --   BILITOT 1.4*  --      Microbiology Results  Results for orders placed or  performed during the hospital encounter of 11/11/17  Culture, blood (routine x 2)     Status: None (Preliminary result)   Collection Time: 11/11/17  9:42 PM  Result Value Ref Range Status   Specimen Description BLOOD LEFT HAND  Final   Special Requests   Final    BOTTLES DRAWN AEROBIC AND ANAEROBIC Blood Culture adequate volume   Culture   Final    NO GROWTH < 12 HOURS Performed at [facility], [address] [place], [place] [address]    Report Status PENDING  Incomplete  Culture, blood (routine x 2)     Status: None (Preliminary result)   Collection Time: 11/11/17 10:20 PM  Result Value Ref Range Status   Specimen Description   Final    BLOOD LEFT ANTECUBITAL Performed at [facility], [address] [place], [place] [address]    Special Requests   Final    BOTTLES DRAWN AEROBIC AND ANAEROBIC Blood Culture adequate volume Performed at [facility], [address] [place], [place] [address]    Culture   Final    NO GROWTH < 24 HOURS Performed at [facility], [address] [place], [place] [address]    Report Status PENDING  Incomplete    RADIOLOGY:  Ct Head Wo Contrast  Result Date: 11/11/2017 CLINICAL DATA:  57 y/o  F; fall with neck pain and confusion. EXAM: CT HEAD WITHOUT CONTRAST CT CERVICAL SPINE WITHOUT CONTRAST TECHNIQUE: Multidetector CT imaging of the head and cervical spine was performed following the standard protocol without intravenous contrast. Multiplanar CT image reconstructions of the cervical spine were also generated. COMPARISON:  07/07/2017 CT head. FINDINGS: CT HEAD FINDINGS Brain: No evidence of acute infarction, hemorrhage, hydrocephalus, extra-axial collection or mass lesion/mass effect. Vascular: No hyperdense vessel or unexpected calcification. Skull: Normal. Negative for fracture or focal lesion. Sinuses/Orbits: Trace opacification of the right mastoid tip. Small sphenoid sinus fluid level. Orbits are  unremarkable. Other: None. CT CERVICAL SPINE FINDINGS Alignment: Mild reversal of cervical curvature. Skull base and vertebrae: No acute fracture. No primary bone lesion or focal pathologic process. C5-6 ACDF. Hardware is intact and there is no apparent hardware related complication. Soft tissues and spinal canal: No prevertebral fluid or swelling. No visible canal hematoma. Disc levels: Mild cervical spondylosis with discogenic degenerative changes predominantly C3-C5 and C6-7 as well as prominent left-sided C4-5 facet arthrosis. No high-grade bony foraminal stenosis. Mild C5-7 canal stenosis. Upper chest: Negative. Other: Negative. IMPRESSION: 1. No acute intracranial abnormality or calvarial fracture. Unremarkable CT of the head. 2. No acute fracture  or dislocation of the cervical spine. 3. Mild cervical spondylosis.  No high-grade bony canal stenosis. Electronically Signed   By: Kristine Garbe M.D.   On: 11/11/2017 22:31   Ct Chest W Contrast  Result Date: 11/12/2017 CLINICAL DATA:  Right chest wall cellulitis. The patient appears to have had bilateral mastectomies with bilateral subpectoral breast implants. EXAM: CT CHEST WITH CONTRAST TECHNIQUE: Multidetector CT imaging of the chest was performed during intravenous contrast administration. CONTRAST:  83mL OMNIPAQUE IOHEXOL 300 MG/ML  SOLN COMPARISON:  None. FINDINGS: Cardiovascular: No significant vascular findings. Normal heart size. No pericardial effusion. Mediastinum/Nodes: No enlarged mediastinal, hilar, or axillary lymph nodes. Thyroid gland, trachea, and esophagus demonstrate no significant findings. Lungs/Pleura: Lungs are clear. No pleural effusion or pneumothorax. Upper Abdomen: No acute abnormality. Musculoskeletal: There is a prominent fluid collection surrounding the right breast implant as well as edema in the adjacent subcutaneous fat. There is abnormal soft tissue stranding in the right axilla without definable adenopathy. This  could represent areas of cellulitis of the possibility of this representing tumor should also be considered. These irregular areas of soft tissue density extends around the right axillary artery and vein and right brachial plexus. Subcutaneous edema extends into the lateral right chest wall below the reconstructed breast and along the muscles of the right lateral chest wall. The right breast implant contour is slightly irregular as compared to the left breast implant contour but overall size of the implant is essentially the same. IMPRESSION: 1. Extensive fluid around the right breast implant with edema in the adjacent soft tissues. This could represent an abscess around the implant. 2. Abnormal irregular soft tissue density in the right axilla and right lateral chest wall. This could represent focal areas of cellulitis but I cannot exclude tumor at this site. There is no peripheral enhancement to suggest abscess in the axillary soft tissues. 3. No significant adenopathy. Several small right axillary lymph nodes, too small to characterize. Electronically Signed   By: Lorriane Shire M.D.   On: 11/12/2017 14:12   Ct Cervical Spine Wo Contrast  Result Date: 11/11/2017 CLINICAL DATA:  57 y/o  F; fall with neck pain and confusion. EXAM: CT HEAD WITHOUT CONTRAST CT CERVICAL SPINE WITHOUT CONTRAST TECHNIQUE: Multidetector CT imaging of the head and cervical spine was performed following the standard protocol without intravenous contrast. Multiplanar CT image reconstructions of the cervical spine were also generated. COMPARISON:  07/07/2017 CT head. FINDINGS: CT HEAD FINDINGS Brain: No evidence of acute infarction, hemorrhage, hydrocephalus, extra-axial collection or mass lesion/mass effect. Vascular: No hyperdense vessel or unexpected calcification. Skull: Normal. Negative for fracture or focal lesion. Sinuses/Orbits: Trace opacification of the right mastoid tip. Small sphenoid sinus fluid level. Orbits are  unremarkable. Other: None. CT CERVICAL SPINE FINDINGS Alignment: Mild reversal of cervical curvature. Skull base and vertebrae: No acute fracture. No primary bone lesion or focal pathologic process. C5-6 ACDF. Hardware is intact and there is no apparent hardware related complication. Soft tissues and spinal canal: No prevertebral fluid or swelling. No visible canal hematoma. Disc levels: Mild cervical spondylosis with discogenic degenerative changes predominantly C3-C5 and C6-7 as well as prominent left-sided C4-5 facet arthrosis. No high-grade bony foraminal stenosis. Mild C5-7 canal stenosis. Upper chest: Negative. Other: Negative. IMPRESSION: 1. No acute intracranial abnormality or calvarial fracture. Unremarkable CT of the head. 2. No acute fracture or dislocation of the cervical spine. 3. Mild cervical spondylosis.  No high-grade bony canal stenosis. Electronically Signed   By: Edgardo Roys.D.  On: 11/11/2017 22:31   Dg Chest Portable 1 View  Result Date: 11/11/2017 CLINICAL DATA:  57 y/o  F; chest pain after fall. EXAM: PORTABLE CHEST 1 VIEW COMPARISON:  08/24/2009 chest radiographs FINDINGS: Normal cardiac silhouette. The partially visualized anterior cervical fusion hardware and spinal stimulator leads are stable. Surgical clips project over left hilum. Clear lungs. No pleural effusion or pneumothorax. No acute osseous abnormality is evident. Mild reverse S curvature of the spine. IMPRESSION: No acute process identified. Electronically Signed   By: Kristine Garbe M.D.   On: 11/11/2017 22:53     Management plans discussed with the patient, family and they are in agreement.  CODE STATUS: Full Code   TOTAL TIME TAKING CARE OF THIS PATIENT: 32 minutes.    Demetrios Loll M.D on 11/12/2017 at 3:52 PM  Between 7am to 6pm - Pager - [phone]  After 6pm go to [email or web address] - Proofreader  [facility]  Office  [phone]  CC: Primary care  physician; Charlann Boxer, MD   Note: This dictation was prepared with Dragon dictation along with smaller phrase technology. Any transcriptional errors that result from this process are unintentional.

## 2017-11-12 NOTE — Progress Notes (Addendum)
[facility] NAME: Rebecca Colon    MR#:  [id number]  DATE OF BIRTH:  20-Nov-1960  SUBJECTIVE:  CHIEF COMPLAINT:   Chief Complaint  Patient presents with  . Fall   Still severe pain on the right breast and axilla REVIEW OF SYSTEMS:  Review of Systems  Constitutional: Negative for chills, fever and malaise/fatigue.  HENT: Negative for sore throat.   Eyes: Negative for blurred vision and double vision.  Respiratory: Negative for cough, hemoptysis, shortness of breath, wheezing and stridor.   Cardiovascular: Negative for chest pain, palpitations, orthopnea and leg swelling.  Gastrointestinal: Negative for abdominal pain, blood in stool, diarrhea, melena, nausea and vomiting.  Genitourinary: Negative for dysuria, flank pain and hematuria.  Musculoskeletal: Negative for back pain and joint pain.  Skin: Negative for rash.       Erythema, swelling and tenderness on the right breast.  Neurological: Negative for dizziness, sensory change, focal weakness, seizures, loss of consciousness, weakness and headaches.  Endo/Heme/Allergies: Negative for polydipsia.  Psychiatric/Behavioral: Negative for depression. The patient is not nervous/anxious.     DRUG ALLERGIES:   Allergies  Allergen Reactions  . Penicillins Anaphylaxis and Other (See Comments)    **Patient received ceftriaxone 1g IV in ED on 07/08/17 w/o ADR Has patient had a PCN reaction causing immediate rash, facial/tongue/throat swelling, SOB or lightheadedness with hypotension: Yes Has patient had a PCN reaction causing severe rash involving mucus membranes or skin necrosis: Unknown Has patient had a PCN reaction that required hospitalization: Yes Has patient had a PCN reaction occurring within the last 10 years: No If all above answers are "NO", then may proceed with Cephalosporin use.   VITALS:  Blood pressure (!) 114/59, pulse 84, temperature 98.7 F (37.1 C), temperature  source Oral, resp. rate 14, height 5\' 6"  (1.676 m), weight 91.4 kg, SpO2 97 %. PHYSICAL EXAMINATION:  Physical Exam  Constitutional: She is oriented to person, place, and time. She appears well-developed.  Obese obesity.  HENT:  Head: Normocephalic.  Mouth/Throat: Oropharynx is clear and moist.  Eyes: Pupils are equal, round, and reactive to light. Conjunctivae and EOM are normal. No scleral icterus.  Neck: Normal range of motion. Neck supple. No JVD present. No tracheal deviation present.  Cardiovascular: Normal rate, regular rhythm and normal heart sounds. Exam reveals no gallop.  No murmur heard. Pulmonary/Chest: Effort normal and breath sounds normal. No respiratory distress. She has no wheezes. She has no rales.  Abdominal: Soft. Bowel sounds are normal. She exhibits no distension. There is no tenderness. There is no rebound.  Musculoskeletal: Normal range of motion. She exhibits no edema or tenderness.  Neurological: She is alert and oriented to person, place, and time. No cranial nerve deficit.  Skin: Skin is warm. No rash noted. There is erythema.  Erythema, swelling and tenderness on the right breast.  Psychiatric: She has a normal mood and affect.   LABORATORY PANEL:  Female CBC Recent Labs  Lab 11/12/17 0410  WBC 35.3*  HGB 13.5  HCT 39.1  PLT 307   ------------------------------------------------------------------------------------------------------------------ Chemistries  Recent Labs  Lab 11/11/17 2141 11/12/17 0410  NA 132* 131*  K 3.3* 3.9  CL 92* 89*  CO2 25 29  GLUCOSE 143* 135*  BUN 10 11  CREATININE 0.94 0.84  CALCIUM 8.3* 8.2*  AST 77*  --   ALT 55*  --   ALKPHOS 108  --   BILITOT 1.4*  --  RADIOLOGY:  Ct Head Wo Contrast  Result Date: 11/11/2017 CLINICAL DATA:  57 y/o  F; fall with neck pain and confusion. EXAM: CT HEAD WITHOUT CONTRAST CT CERVICAL SPINE WITHOUT CONTRAST TECHNIQUE: Multidetector CT imaging of the head and cervical spine was  performed following the standard protocol without intravenous contrast. Multiplanar CT image reconstructions of the cervical spine were also generated. COMPARISON:  07/07/2017 CT head. FINDINGS: CT HEAD FINDINGS Brain: No evidence of acute infarction, hemorrhage, hydrocephalus, extra-axial collection or mass lesion/mass effect. Vascular: No hyperdense vessel or unexpected calcification. Skull: Normal. Negative for fracture or focal lesion. Sinuses/Orbits: Trace opacification of the right mastoid tip. Small sphenoid sinus fluid level. Orbits are unremarkable. Other: None. CT CERVICAL SPINE FINDINGS Alignment: Mild reversal of cervical curvature. Skull base and vertebrae: No acute fracture. No primary bone lesion or focal pathologic process. C5-6 ACDF. Hardware is intact and there is no apparent hardware related complication. Soft tissues and spinal canal: No prevertebral fluid or swelling. No visible canal hematoma. Disc levels: Mild cervical spondylosis with discogenic degenerative changes predominantly C3-C5 and C6-7 as well as prominent left-sided C4-5 facet arthrosis. No high-grade bony foraminal stenosis. Mild C5-7 canal stenosis. Upper chest: Negative. Other: Negative. IMPRESSION: 1. No acute intracranial abnormality or calvarial fracture. Unremarkable CT of the head. 2. No acute fracture or dislocation of the cervical spine. 3. Mild cervical spondylosis.  No high-grade bony canal stenosis. Electronically Signed   By: Kristine Garbe M.D.   On: 11/11/2017 22:31   Ct Chest W Contrast  Result Date: 11/12/2017 CLINICAL DATA:  Right chest wall cellulitis. The patient appears to have had bilateral mastectomies with bilateral subpectoral breast implants. EXAM: CT CHEST WITH CONTRAST TECHNIQUE: Multidetector CT imaging of the chest was performed during intravenous contrast administration. CONTRAST:  2mL OMNIPAQUE IOHEXOL 300 MG/ML  SOLN COMPARISON:  None. FINDINGS: Cardiovascular: No significant vascular  findings. Normal heart size. No pericardial effusion. Mediastinum/Nodes: No enlarged mediastinal, hilar, or axillary lymph nodes. Thyroid gland, trachea, and esophagus demonstrate no significant findings. Lungs/Pleura: Lungs are clear. No pleural effusion or pneumothorax. Upper Abdomen: No acute abnormality. Musculoskeletal: There is a prominent fluid collection surrounding the right breast implant as well as edema in the adjacent subcutaneous fat. There is abnormal soft tissue stranding in the right axilla without definable adenopathy. This could represent areas of cellulitis of the possibility of this representing tumor should also be considered. These irregular areas of soft tissue density extends around the right axillary artery and vein and right brachial plexus. Subcutaneous edema extends into the lateral right chest wall below the reconstructed breast and along the muscles of the right lateral chest wall. The right breast implant contour is slightly irregular as compared to the left breast implant contour but overall size of the implant is essentially the same. IMPRESSION: 1. Extensive fluid around the right breast implant with edema in the adjacent soft tissues. This could represent an abscess around the implant. 2. Abnormal irregular soft tissue density in the right axilla and right lateral chest wall. This could represent focal areas of cellulitis but I cannot exclude tumor at this site. There is no peripheral enhancement to suggest abscess in the axillary soft tissues. 3. No significant adenopathy. Several small right axillary lymph nodes, too small to characterize. Electronically Signed   By: Lorriane Shire M.D.   On: 11/12/2017 14:12   Ct Cervical Spine Wo Contrast  Result Date: 11/11/2017 CLINICAL DATA:  57 y/o  F; fall with neck pain and confusion. EXAM: CT  HEAD WITHOUT CONTRAST CT CERVICAL SPINE WITHOUT CONTRAST TECHNIQUE: Multidetector CT imaging of the head and cervical spine was performed  following the standard protocol without intravenous contrast. Multiplanar CT image reconstructions of the cervical spine were also generated. COMPARISON:  07/07/2017 CT head. FINDINGS: CT HEAD FINDINGS Brain: No evidence of acute infarction, hemorrhage, hydrocephalus, extra-axial collection or mass lesion/mass effect. Vascular: No hyperdense vessel or unexpected calcification. Skull: Normal. Negative for fracture or focal lesion. Sinuses/Orbits: Trace opacification of the right mastoid tip. Small sphenoid sinus fluid level. Orbits are unremarkable. Other: None. CT CERVICAL SPINE FINDINGS Alignment: Mild reversal of cervical curvature. Skull base and vertebrae: No acute fracture. No primary bone lesion or focal pathologic process. C5-6 ACDF. Hardware is intact and there is no apparent hardware related complication. Soft tissues and spinal canal: No prevertebral fluid or swelling. No visible canal hematoma. Disc levels: Mild cervical spondylosis with discogenic degenerative changes predominantly C3-C5 and C6-7 as well as prominent left-sided C4-5 facet arthrosis. No high-grade bony foraminal stenosis. Mild C5-7 canal stenosis. Upper chest: Negative. Other: Negative. IMPRESSION: 1. No acute intracranial abnormality or calvarial fracture. Unremarkable CT of the head. 2. No acute fracture or dislocation of the cervical spine. 3. Mild cervical spondylosis.  No high-grade bony canal stenosis. Electronically Signed   By: Kristine Garbe M.D.   On: 11/11/2017 22:31   Dg Chest Portable 1 View  Result Date: 11/11/2017 CLINICAL DATA:  57 y/o  F; chest pain after fall. EXAM: PORTABLE CHEST 1 VIEW COMPARISON:  08/24/2009 chest radiographs FINDINGS: Normal cardiac silhouette. The partially visualized anterior cervical fusion hardware and spinal stimulator leads are stable. Surgical clips project over left hilum. Clear lungs. No pleural effusion or pneumothorax. No acute osseous abnormality is evident. Mild reverse S  curvature of the spine. IMPRESSION: No acute process identified. Electronically Signed   By: Kristine Garbe M.D.   On: 11/11/2017 22:53   ASSESSMENT AND PLAN:    Cellulitis and possible abscess of right breast with leukocytosis. She failed outpatient Bactrim, is on IV clindamycin since admission, PRN analgesia Changed to meropenem and vancomycin pharmacy to dose. CT of the chest report that possible abscess around breast implant.   Follow-up CBC and surgery consult.  Hyponatremia.  Normal saline IV and follow-up BMP.  History of right breast cancer, status post mastectomy and implant.  Follow-up oncologist as outpatient.   Anxiety -Home dose anxiolytic   Depression -home dose antidepressant Discussed with Dr. Lysle Pearl. All the records are reviewed and case discussed with Care Management/Social Worker. Management plans discussed with the patient, family and they are in agreement.  CODE STATUS: Full Code  TOTAL TIME TAKING CARE OF THIS PATIENT: 45 minutes.   More than 50% of the time was spent in counseling/coordination of care: YES  POSSIBLE D/C IN 3 DAYS, DEPENDING ON CLINICAL CONDITION.   Demetrios Loll M.D on 11/12/2017 at 3:07 PM  Between 7am to 6pm - Pager - [phone]  After 6pm go to [email or web address] - [facility]

## 2017-11-13 LAB — URINE CULTURE: Culture: <10000 — AB

## 2017-11-16 LAB — CULTURE, BLOOD (ROUTINE X 2)
Culture: NO GROWTH
Culture: NO GROWTH
SPECIAL REQUESTS: ADEQUATE
Special Requests: ADEQUATE

## 2018-06-12 ENCOUNTER — Emergency Department: Payer: Medicare Other

## 2018-06-12 ENCOUNTER — Encounter: Payer: Self-pay | Admitting: Emergency Medicine

## 2018-06-12 ENCOUNTER — Other Ambulatory Visit: Payer: Self-pay

## 2018-06-12 ENCOUNTER — Inpatient Hospital Stay
Admission: EM | Admit: 2018-06-12 | Discharge: 2018-06-14 | DRG: 071 | Disposition: A | Discharge status: Home / Self Care | Payer: Medicare Other | Attending: Internal Medicine | Admitting: Internal Medicine

## 2018-06-12 DIAGNOSIS — Z825 Family history of asthma and other chronic lower respiratory diseases: Secondary | ICD-10-CM

## 2018-06-12 DIAGNOSIS — F329 Major depressive disorder, single episode, unspecified: Secondary | ICD-10-CM | POA: Diagnosis present

## 2018-06-12 DIAGNOSIS — F19939 Other psychoactive substance use, unspecified with withdrawal, unspecified: Secondary | ICD-10-CM | POA: Diagnosis present

## 2018-06-12 DIAGNOSIS — Z8249 Family history of ischemic heart disease and other diseases of the circulatory system: Secondary | ICD-10-CM

## 2018-06-12 DIAGNOSIS — R569 Unspecified convulsions: Secondary | ICD-10-CM | POA: Diagnosis present

## 2018-06-12 DIAGNOSIS — Z79899 Other long term (current) drug therapy: Secondary | ICD-10-CM

## 2018-06-12 DIAGNOSIS — E876 Hypokalemia: Secondary | ICD-10-CM | POA: Diagnosis present

## 2018-06-12 DIAGNOSIS — G4733 Obstructive sleep apnea (adult) (pediatric): Secondary | ICD-10-CM | POA: Diagnosis present

## 2018-06-12 DIAGNOSIS — Z888 Allergy status to other drugs, medicaments and biological substances status: Secondary | ICD-10-CM | POA: Diagnosis not present

## 2018-06-12 DIAGNOSIS — Z981 Arthrodesis status: Secondary | ICD-10-CM | POA: Diagnosis not present

## 2018-06-12 DIAGNOSIS — R9401 Abnormal electroencephalogram [EEG]: Secondary | ICD-10-CM | POA: Diagnosis present

## 2018-06-12 DIAGNOSIS — F419 Anxiety disorder, unspecified: Secondary | ICD-10-CM | POA: Diagnosis present

## 2018-06-12 DIAGNOSIS — Z880 Allergy status to penicillin: Secondary | ICD-10-CM | POA: Diagnosis not present

## 2018-06-12 DIAGNOSIS — Z853 Personal history of malignant neoplasm of breast: Secondary | ICD-10-CM

## 2018-06-12 DIAGNOSIS — Z9882 Breast implant status: Secondary | ICD-10-CM | POA: Diagnosis not present

## 2018-06-12 DIAGNOSIS — G9340 Encephalopathy, unspecified: Secondary | ICD-10-CM

## 2018-06-12 DIAGNOSIS — R4182 Altered mental status, unspecified: Secondary | ICD-10-CM | POA: Diagnosis present

## 2018-06-12 DIAGNOSIS — J449 Chronic obstructive pulmonary disease, unspecified: Secondary | ICD-10-CM | POA: Diagnosis present

## 2018-06-12 DIAGNOSIS — Z7951 Long term (current) use of inhaled steroids: Secondary | ICD-10-CM

## 2018-06-12 DIAGNOSIS — F4310 Post-traumatic stress disorder, unspecified: Secondary | ICD-10-CM | POA: Diagnosis present

## 2018-06-12 DIAGNOSIS — D72829 Elevated white blood cell count, unspecified: Secondary | ICD-10-CM | POA: Diagnosis present

## 2018-06-12 DIAGNOSIS — Z87891 Personal history of nicotine dependence: Secondary | ICD-10-CM

## 2018-06-12 DIAGNOSIS — M797 Fibromyalgia: Secondary | ICD-10-CM | POA: Diagnosis present

## 2018-06-12 DIAGNOSIS — G9341 Metabolic encephalopathy: Principal | ICD-10-CM | POA: Diagnosis present

## 2018-06-12 LAB — CBC WITH DIFFERENTIAL/PLATELET
Abs Immature Granulocytes: 0.09 10*3/uL — ABNORMAL HIGH (ref 0.00–0.07)
Basophils Absolute: 0 10*3/uL (ref 0.0–0.1)
Basophils Relative: 0 %
Eosinophils Absolute: 0 10*3/uL (ref 0.0–0.5)
Eosinophils Relative: 0 %
HEMATOCRIT: 41.8 % (ref 36.0–46.0)
HEMOGLOBIN: 15.4 g/dL — AB (ref 12.0–15.0)
Immature Granulocytes: 1 %
Lymphocytes Relative: 7 %
Lymphs Abs: 1.1 10*3/uL (ref 0.7–4.0)
MCH: 30.9 pg (ref 26.0–34.0)
MCHC: 36.8 g/dL — ABNORMAL HIGH (ref 30.0–36.0)
MCV: 83.9 fL (ref 80.0–100.0)
MONOS PCT: 8 %
Monocytes Absolute: 1.4 10*3/uL — ABNORMAL HIGH (ref 0.1–1.0)
Neutro Abs: 13.7 10*3/uL — ABNORMAL HIGH (ref 1.7–7.7)
Neutrophils Relative %: 84 %
Platelets: 289 10*3/uL (ref 150–400)
RBC: 4.98 MIL/uL (ref 3.87–5.11)
RDW: 11.7 % (ref 11.5–15.5)
WBC: 16.4 10*3/uL — ABNORMAL HIGH (ref 4.0–10.5)
nRBC: 0 % (ref 0.0–0.2)

## 2018-06-12 LAB — COMPREHENSIVE METABOLIC PANEL
ALBUMIN: 5 g/dL (ref 3.5–5.0)
ALT: 41 U/L (ref 0–44)
AST: 48 U/L — AB (ref 15–41)
Alkaline Phosphatase: 82 U/L (ref 38–126)
Anion gap: 13 (ref 5–15)
BUN: 16 mg/dL (ref 6–20)
CO2: 23 mmol/L (ref 22–32)
Calcium: 9.9 mg/dL (ref 8.9–10.3)
Chloride: 104 mmol/L (ref 98–111)
Creatinine, Ser: 0.64 mg/dL (ref 0.44–1.00)
GFR calc Af Amer: >60 mL/min (ref >60)
GFR calc non Af Amer: >60 mL/min (ref >60)
Glucose, Bld: 147 mg/dL — ABNORMAL HIGH (ref 70–99)
Potassium: 3 mmol/L — ABNORMAL LOW (ref 3.5–5.1)
Sodium: 140 mmol/L (ref 135–145)
Total Bilirubin: 1.4 mg/dL — ABNORMAL HIGH (ref 0.3–1.2)
Total Protein: 8.2 g/dL — ABNORMAL HIGH (ref 6.5–8.1)

## 2018-06-12 LAB — URINALYSIS, COMPLETE (UACMP) WITH MICROSCOPIC
BACTERIA UA: NONE SEEN
Bilirubin Urine: NEGATIVE
Glucose, UA: NEGATIVE mg/dL
Ketones, ur: 80 mg/dL — AB
Leukocytes,Ua: NEGATIVE
Nitrite: NEGATIVE
Protein, ur: 100 mg/dL — AB
Specific Gravity, Urine: 1.031 — ABNORMAL HIGH (ref 1.005–1.030)
pH: 5 (ref 5.0–8.0)

## 2018-06-12 LAB — URINE DRUG SCREEN, QUALITATIVE (ARMC ONLY)
Amphetamines, Ur Screen: NOT DETECTED
BARBITURATES, UR SCREEN: NOT DETECTED
Benzodiazepine, Ur Scrn: NOT DETECTED
Cannabinoid 50 Ng, Ur ~~LOC~~: NOT DETECTED
Cocaine Metabolite,Ur ~~LOC~~: NOT DETECTED
MDMA (Ecstasy)Ur Screen: NOT DETECTED
Methadone Scn, Ur: NOT DETECTED
Opiate, Ur Screen: POSITIVE — AB
Phencyclidine (PCP) Ur S: NOT DETECTED
Tricyclic, Ur Screen: NOT DETECTED

## 2018-06-12 LAB — ETHANOL

## 2018-06-12 MED ORDER — ONDANSETRON HCL 4 MG/2ML IJ SOLN: 4.0000 mg | Freq: Four times a day (QID) | INTRAMUSCULAR | Status: DC | PRN

## 2018-06-12 MED ORDER — TIOTROPIUM BROMIDE MONOHYDRATE 18 MCG IN CAPS
18.0000 ug | ORAL_CAPSULE | Freq: Every day | RESPIRATORY_TRACT | Status: DC
  Filled 2018-06-12: qty 5

## 2018-06-12 MED ORDER — MORPHINE SULFATE ER 30 MG PO TBCR
60.0000 mg | EXTENDED_RELEASE_TABLET | Freq: Three times a day (TID) | ORAL | Status: DC
  Administered 2018-06-13 – 2018-06-14 (×5): 60 mg via ORAL
  Filled 2018-06-12 (×2): qty 2
  Filled 2018-06-12: qty 4
  Filled 2018-06-12 (×3): qty 2

## 2018-06-12 MED ORDER — ENOXAPARIN SODIUM 40 MG/0.4ML ~~LOC~~ SOLN
40.0000 mg | SUBCUTANEOUS | Status: DC
  Administered 2018-06-13: 40 mg via SUBCUTANEOUS
  Filled 2018-06-12: qty 0.4

## 2018-06-12 MED ORDER — POTASSIUM CHLORIDE CRYS ER 20 MEQ PO TBCR
40.0000 meq | EXTENDED_RELEASE_TABLET | Freq: Once | ORAL | Status: DC
  Filled 2018-06-12: qty 2

## 2018-06-12 MED ORDER — MONTELUKAST SODIUM 10 MG PO TABS
10.0000 mg | ORAL_TABLET | Freq: Every day | ORAL | Status: DC
  Administered 2018-06-13 – 2018-06-14 (×2): 10 mg via ORAL
  Filled 2018-06-12 (×3): qty 1

## 2018-06-12 MED ORDER — MOMETASONE FURO-FORMOTEROL FUM 200-5 MCG/ACT IN AERO
2.0000 | INHALATION_SPRAY | Freq: Two times a day (BID) | RESPIRATORY_TRACT | Status: DC
  Administered 2018-06-13 – 2018-06-14 (×2): 2 via RESPIRATORY_TRACT
  Filled 2018-06-12 (×2): qty 8.8

## 2018-06-12 MED ORDER — MORPHINE SULFATE (PF) 4 MG/ML IV SOLN
INTRAVENOUS | Status: AC
  Administered 2018-06-12: 16:00:00
  Filled 2018-06-12: qty 1

## 2018-06-12 MED ORDER — SODIUM CHLORIDE 0.9 % IV SOLN
INTRAVENOUS | Status: DC
  Administered 2018-06-13 (×2): via INTRAVENOUS

## 2018-06-12 MED ORDER — PREGABALIN 75 MG PO CAPS
200.0000 mg | ORAL_CAPSULE | Freq: Three times a day (TID) | ORAL | Status: DC
  Administered 2018-06-13 – 2018-06-14 (×4): 200 mg via ORAL
  Filled 2018-06-12 (×5): qty 1

## 2018-06-12 MED ORDER — DULOXETINE HCL 60 MG PO CPEP
60.0000 mg | ORAL_CAPSULE | Freq: Every day | ORAL | Status: DC
  Administered 2018-06-13: 60 mg via ORAL
  Filled 2018-06-12 (×2): qty 1

## 2018-06-12 MED ORDER — POLYETHYLENE GLYCOL 3350 17 G PO PACK: 17.0000 g | PACK | Freq: Every day | ORAL | Status: DC | PRN

## 2018-06-12 MED ORDER — ACETAMINOPHEN 325 MG PO TABS
650.0000 mg | ORAL_TABLET | Freq: Four times a day (QID) | ORAL | Status: DC | PRN
  Administered 2018-06-13: 650 mg via ORAL
  Filled 2018-06-12: qty 2

## 2018-06-12 MED ORDER — DOCUSATE SODIUM 100 MG PO CAPS
100.0000 mg | ORAL_CAPSULE | Freq: Two times a day (BID) | ORAL | Status: DC
  Administered 2018-06-13 – 2018-06-14 (×3): 100 mg via ORAL
  Filled 2018-06-12 (×3): qty 1

## 2018-06-12 MED ORDER — ACETAMINOPHEN 650 MG RE SUPP: 650.0000 mg | Freq: Four times a day (QID) | RECTAL | Status: DC | PRN

## 2018-06-12 MED ORDER — BACLOFEN 10 MG PO TABS
20.0000 mg | ORAL_TABLET | Freq: Three times a day (TID) | ORAL | Status: DC | PRN
  Filled 2018-06-12 (×2): qty 2

## 2018-06-12 MED ORDER — MORPHINE SULFATE 15 MG PO TABS
30.0000 mg | ORAL_TABLET | Freq: Two times a day (BID) | ORAL | Status: DC | PRN
  Administered 2018-06-13: 30 mg via ORAL
  Filled 2018-06-12: qty 2

## 2018-06-12 MED ORDER — ONDANSETRON HCL 4 MG PO TABS: 4.0000 mg | ORAL_TABLET | Freq: Four times a day (QID) | ORAL | Status: DC | PRN

## 2018-06-12 NOTE — ED Provider Notes (Signed)
[facility] Emergency Department Provider Note  ____________________________________________  Time seen: Approximately 3:54 PM  I have reviewed the triage vital signs and the nursing notes.   HISTORY  Chief Complaint Altered Mental Status  Level 5 caveat:  Portions of the history and physical were unable to be obtained due to AMS   HPI Rebecca Colon is a 58 y.o. female with a history of DCIS of the right breast, PTSD, depression, fibromyalgia and chronic pain on large doses of morphine daily who presents for altered mental status.  Per EMS patient has been sleeping a lot over the last few days and has not been taking her medications as prescribed.  She is on large daily amounts of long-acting and short acting morphine.  Today patient became very restless, agitated, and encephalopathic which prompted EMS to be called.  Patient is restless and moving around in bed, unable to answer any questions, will answer yes to anything I ask her.  Past Medical History:  Diagnosis Date  . Anemia   . Anxiety   . Asthma   . Breast cancer ([place])   . 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 Depression   . DVT (deep venous thrombosis) ([place])   . Sciatic nerve injury     Patient Active Problem List   Diagnosis Date Noted  . Cellulitis of breast 11/12/2017  . Cellulitis 11/11/2017  . Acute encephalopathy 07/08/2017  . UTI (urinary tract infection) 07/08/2017  . Anxiety 07/08/2017  . Depression 07/08/2017    Past Surgical History:  Procedure Laterality Date  . CERVICAL FUSION    . FASCIOTOMY Left   . LUMBAR FUSION    . MASTECTOMY    . SPINAL CORD STIMULATOR IMPLANT    . TONSILLECTOMY      Prior to Admission medications   Medication Sig Start Date End Date Taking? Authorizing Provider  ADVAIR DISKUS 500-50 MCG/DOSE AEPB Inhale 1 puff into the lungs 2 (two) times daily. 09/26/17   [provider]  albuterol (PROVENTIL HFA;VENTOLIN HFA) 108  (90 Base) MCG/ACT inhaler Inhale 2 puffs into the lungs every 6 (six) hours as needed for wheezing or shortness of breath.    [provider]  baclofen (LIORESAL) 20 MG tablet Take 20 mg by mouth every 8 (eight) hours as needed for muscle spasms.     [provider]  docusate sodium (COLACE) 100 MG capsule Take 1 capsule (100 mg total) by mouth 2 (two) times daily. Patient not taking: Reported on 11/11/2017 07/10/17   Gladstone Lighter, MD  DULoxetine (CYMBALTA) 60 MG capsule Take 60 mg by mouth at bedtime.    [provider]  escitalopram (LEXAPRO) 20 MG tablet Take 20 mg by mouth daily.    [provider]  meropenem 1 g in sodium chloride 0.9 % 100 mL Inject 1 g into the vein every 8 (eight) hours. 11/12/17   Demetrios Loll, MD  montelukast (SINGULAIR) 10 MG tablet Take 10 mg by mouth daily.    [provider]  morphine (MS CONTIN) 60 MG 12 hr tablet Take 60 mg by mouth every 8 (eight) hours.    [provider]  morphine (MSIR) 30 MG tablet Take 30 mg by mouth 2 (two) times daily as needed for severe pain.    [provider]  MOVANTIK 25 MG TABS tablet Take 25 mg by mouth daily. 10/19/17   [provider]  pregabalin (LYRICA) 200 MG capsule Take 200 mg  by mouth 3 (three) times daily.    [provider]  tiotropium (SPIRIVA) 18 MCG inhalation capsule Place 18 mcg into inhaler and inhale daily.    [provider]  vancomycin 1,250 mg in sodium chloride 0.9 % 250 mL Inject 1,250 mg into the vein every 12 (twelve) hours. 11/12/17   Demetrios Loll, MD    Allergies Other; Penicillins; and Tape  Family History  Problem Relation Age of Onset  . COPD Mother   . Heart disease Mother   . Hypertension Mother   . COPD Father   . Heart disease Father   . Hypertension Father     Social History Social History   Tobacco Use  . Smoking status: Former Smoker    Last attempt to quit: 09/12/2012    Years since quitting: 5.7   . Smokeless tobacco: Never Used  Substance Use Topics  . Alcohol use: Yes  . Drug use: No    Review of Systems  Constitutional: Negative for fever. + AMS ____________________________________________   PHYSICAL EXAM:  VITAL SIGNS: ED Triage Vitals  Enc Vitals Group     BP 06/12/18 1507 120/82     Pulse Rate 06/12/18 1507 83     Resp 06/12/18 1507 18     Temp 06/12/18 1507 97.7 F (36.5 C)     Temp Source 06/12/18 1507 Oral     SpO2 06/12/18 1507 96 %     Weight 06/12/18 1507 198 lb 6.6 oz (90 kg)     Height 06/12/18 1507 5\' 7"  (1.702 m)     Head Circumference --      Peak Flow --      Pain Score 06/12/18 1513 0     Pain Loc --      Pain Edu? --      Excl. in [place]? --     Constitutional: Awake, encephalopathic, restless, not answering questions, follows commands HEENT:      Head: Normocephalic and atraumatic.         Eyes: Conjunctivae are normal. Sclera is non-icteric.  Pupils are dilated but reactive      Mouth/Throat: Mucous membranes are dry.       Neck: Supple with no signs of meningismus. Cardiovascular: Regular rate and rhythm. No murmurs, gallops, or rubs. 2+ symmetrical distal pulses are present in all extremities. No JVD. Respiratory: Normal respiratory effort. Lungs are clear to auscultation bilaterally. No wheezes, crackles, or rhonchi.  Gastrointestinal: Soft, non tender, and non distended with positive bowel sounds. No rebound or guarding. Musculoskeletal: No edema, cyanosis, or erythema of extremities. Neurologic: GCS 14. Face is symmetric. Pupils dilated but reactive. Moving all extremities. No gross focal neurologic deficits are appreciated. Skin: Skin is warm, dry and intact. No rash noted. Psychiatric: Speech and behavior are abnormal.  ____________________________________________   LABS (all labs ordered are listed, but only abnormal results are displayed)  Labs Reviewed  COMPREHENSIVE METABOLIC PANEL - Abnormal; Notable for the following  components:      Result Value   Potassium 3.0 (*)    Glucose, Bld 147 (*)    Total Protein 8.2 (*)    AST 48 (*)    Total Bilirubin 1.4 (*)    All other components within normal limits  CBC WITH DIFFERENTIAL/PLATELET - Abnormal; Notable for the following components:   WBC 16.4 (*)    Hemoglobin 15.4 (*)    MCHC 36.8 (*)    Neutro Abs 13.7 (*)    Monocytes Absolute 1.4 (*)  Abs Immature Granulocytes 0.09 (*)    All other components within normal limits  ETHANOL  URINALYSIS, COMPLETE (UACMP) WITH MICROSCOPIC  URINE DRUG SCREEN, QUALITATIVE ([facility] ONLY)  AMMONIA  CBG MONITORING, ED   ____________________________________________  EKG  none  ____________________________________________  RADIOLOGY  I have personally reviewed the images performed during this visit and I agree with the Radiologist's read.   Interpretation by Radiologist:  Ct Head Wo Contrast  Result Date: 06/12/2018 CLINICAL DATA:  Encephalopathy.  Abnormal movements. EXAM: CT HEAD WITHOUT CONTRAST TECHNIQUE: Contiguous axial images were obtained from the base of the skull through the vertex without intravenous contrast. COMPARISON:  None. FINDINGS: Brain: The study is degraded by patient motion. Multiple attempts were made. There is less motion on the second attempt. No acute infarct, hemorrhage, or mass lesion is present. The ventricles are of normal size. No significant white matter lesions are present. No significant extraaxial fluid collection is present. Vascular: No hyperdense vessel or unexpected calcification. Skull: Calvarium is intact. No focal lytic or blastic lesions are present. Sinuses/Orbits: The paranasal sinuses and mastoid air cells are clear. The globes and orbits are within normal limits. IMPRESSION: Normal CT of the head. Electronically Signed   By: San Morelle M.D.   On: 06/12/2018 16:47      ____________________________________________   PROCEDURES  Procedure(s) performed: None  Procedures Critical Care performed:  None ____________________________________________   INITIAL IMPRESSION / ASSESSMENT AND PLAN / ED COURSE  58 y.o. female with a history of DCIS of the right breast, PTSD, depression, fibromyalgia and chronic pain on large doses of morphine daily who presents for altered mental status.  Patient arrives encephalopathic, restless, really not answering any questions appropriately, her pupils are dilated but reactive, she follows commands and is otherwise neurologically intact.  Seems like patient has been sleeping a lot recently and not taking her medications.  Her symptoms could definitely be withdrawing from opiates therefore we will try to give her 4 mg of morphine to see if we can get patient to calm down.  We will send her for head CT especially with a history of cancer to rule out any brain masses or bleeding.  Will check basic labs to rule out infection, electrolyte abnormalities, severe dehydration.  Will do drug screen and ethanol levels.  Clinical Course as of Jun 11 1724  Wed Jun 12, 2018  1718 Labs showing elevated white count but no other acute findings.  UA and drug screen are pending.  Head CT is negative.  Daughter came to visit the patient and said that the last time she heard from the patient was 3 days ago.  She found the patient today sitting on her couch naked covered in stool.  She reports that the patient had prior similar episodes in the setting of not taking her narcotic pain medication.  Patient is mental status has been unchanged after morphine.  Patient remains completely encephalopathic.  We will get her admitted to the hospital   [CV]    Clinical Course User Index [CV] Alfred Levins [place], MD     As part of my medical decision making, I reviewed the following data within the [place] History obtained from family, Nursing notes reviewed and incorporated, Labs reviewed , Old chart reviewed, Radiograph reviewed ,  Discussed with admitting physician , Notes from prior ED visits and [place] Controlled Substance Database    Pertinent labs & imaging results that were available during my care of the patient were reviewed  by me and considered in my medical decision making (see chart for details).    ____________________________________________   FINAL CLINICAL IMPRESSION(S) / ED DIAGNOSES  Final diagnoses:  Acute encephalopathy      NEW MEDICATIONS STARTED DURING THIS VISIT:  ED Discharge Orders    None       Note:  This document was prepared using Dragon voice recognition software and may include unintentional dictation errors.    Alfred Levins, [place], MD 06/12/18 [phone]

## 2018-06-12 NOTE — H&P (Signed)
[facility] [place] NAME: Rebecca Colon    MR#:  [id number]  DATE OF BIRTH:  04-20-1960  DATE OF ADMISSION:  06/12/2018  PRIMARY CARE PHYSICIAN: Charlann Boxer, MD   REQUESTING/REFERRING PHYSICIAN:  Dr Alfred Levins  CHIEF COMPLAINT:   AMS HISTORY OF PRESENT ILLNESS:  Rebecca Colon  is a 58 y.o. female with a known history of PTSD with severe depression presents to the emergency room via EMS due to altered mental status.  Patient was found today by her daughter covered in feces.  The last time her daughter spoke with her was Sunday.  Patient is very confused and unable to obtain HPI from the patient.  HPI is taken from the ER physician and EMS notes.  Patient is on large amounts of narcotics on a daily basis.  She has had 3 episodes of similar issues this year in 2020.  They think that it is from withdrawal of her narcotic.  In the emergency room head CT was negative.  UA is pending.  Chest x-ray shows no active source of infection.  PAST MEDICAL HISTORY:   Past Medical History:  Diagnosis Date  . Anemia   . Anxiety   . Asthma   . Breast cancer ([place])   . 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 Depression   . DVT (deep venous thrombosis) ([place])   . Sciatic nerve injury     PAST SURGICAL HISTORY:   Past Surgical History:  Procedure Laterality Date  . CERVICAL FUSION    . FASCIOTOMY Left   . LUMBAR FUSION    . MASTECTOMY    . SPINAL CORD STIMULATOR IMPLANT    . TONSILLECTOMY      SOCIAL HISTORY:   Social History   Tobacco Use  . Smoking status: Former Smoker    Last attempt to quit: 09/12/2012    Years since quitting: 5.7  . Smokeless tobacco: Never Used  Substance Use Topics  . Alcohol use: Yes    FAMILY HISTORY:   Family History  Problem Relation Age of Onset  . COPD Mother   . Heart disease Mother   . Hypertension Mother   . COPD Father   . Heart disease Father   . Hypertension Father     DRUG  ALLERGIES:   Allergies  Allergen Reactions  . Other Hives    Significant skin reaction to Vicryl suture  . Penicillins Anaphylaxis and Other (See Comments)    **Patient received ceftriaxone 1g IV in ED on 07/08/17 w/o ADR Has patient had a PCN reaction causing immediate rash, facial/tongue/throat swelling, SOB or lightheadedness with hypotension: Yes Has patient had a PCN reaction causing severe rash involving mucus membranes or skin necrosis: Unknown Has patient had a PCN reaction that required hospitalization: Yes Has patient had a PCN reaction occurring within the last 10 years: No If all above answers are "NO", then may proceed with Cephalosporin use.  . Tape Rash    REVIEW OF SYSTEMS:   Review of Systems  Unable to perform ROS: Acuity of condition    MEDICATIONS AT HOME:   Prior to Admission medications   Medication Sig Start Date End Date Taking? Authorizing Provider  ADVAIR DISKUS 500-50 MCG/DOSE AEPB Inhale 1 puff into the lungs 2 (two) times daily. 09/26/17   [provider]  albuterol (PROVENTIL HFA;VENTOLIN HFA) 108 (90 Base) MCG/ACT inhaler Inhale 2 puffs into the lungs every 6 (six) hours as needed for  wheezing or shortness of breath.    [provider]  baclofen (LIORESAL) 20 MG tablet Take 20 mg by mouth every 8 (eight) hours as needed for muscle spasms.     [provider]  docusate sodium (COLACE) 100 MG capsule Take 1 capsule (100 mg total) by mouth 2 (two) times daily. Patient not taking: Reported on 11/11/2017 07/10/17   Gladstone Lighter, MD  DULoxetine (CYMBALTA) 60 MG capsule Take 60 mg by mouth at bedtime.    [provider]                montelukast (SINGULAIR) 10 MG tablet Take 10 mg by mouth daily.    [provider]  morphine (MS CONTIN) 60 MG 12 hr tablet Take 60 mg by mouth every 8 (eight) hours.    [provider]  morphine (MSIR) 30 MG tablet Take 30 mg by mouth 2 (two) times daily as needed for  severe pain.    [provider]  MOVANTIK 25 MG TABS tablet Take 25 mg by mouth daily. 10/19/17   [provider]  pregabalin (LYRICA) 200 MG capsule Take 200 mg by mouth 3 (three) times daily.    [provider]  tiotropium (SPIRIVA) 18 MCG inhalation capsule Place 18 mcg into inhaler and inhale daily.    [provider]             VITAL SIGNS:  Blood pressure 120/82, pulse 83, temperature 97.7 F (36.5 C), temperature source Oral, resp. rate 18, height 5\' 7"  (1.702 m), weight 90 kg, SpO2 96 %.  PHYSICAL EXAMINATION:   Physical Exam Constitutional:      General: She is not in acute distress. HENT:     Head: Normocephalic.  Eyes:     General: No scleral icterus. Neck:     Musculoskeletal: Normal range of motion and neck supple.     Vascular: No JVD.     Trachea: No tracheal deviation.  Cardiovascular:     Rate and Rhythm: Normal rate and regular rhythm.     Heart sounds: Normal heart sounds. No murmur. No friction rub. No gallop.   Pulmonary:     Effort: Pulmonary effort is normal. No respiratory distress.     Breath sounds: Normal breath sounds. No wheezing or rales.  Chest:     Chest wall: No tenderness.  Abdominal:     General: Bowel sounds are normal. There is no distension.     Palpations: Abdomen is soft. There is no mass.     Tenderness: There is no abdominal tenderness. There is no guarding or rebound.  Musculoskeletal: Normal range of motion.  Skin:    General: Skin is warm.     Findings: No erythema or rash.  Neurological:     Mental Status: She is alert.     Comments: Oriented to name only not place or time  Psychiatric:        Judgment: Judgment normal.     Comments: Patient is confused and very restless and agitated       LABORATORY PANEL:   CBC Recent Labs  Lab 06/12/18 1528  WBC 16.4*  HGB 15.4*  HCT 41.8  PLT 289    ------------------------------------------------------------------------------------------------------------------  Chemistries  Recent Labs  Lab 06/12/18 1527  NA 140  K 3.0*  CL 104  CO2 23  GLUCOSE 147*  BUN 16  CREATININE 0.64  CALCIUM 9.9  AST 48*  ALT 41  ALKPHOS 82  BILITOT 1.4*   ------------------------------------------------------------------------------------------------------------------  Cardiac Enzymes No results for input(s): TROPONINI in the last 168 hours. ------------------------------------------------------------------------------------------------------------------  RADIOLOGY:  Ct Head Wo Contrast  Result Date: 06/12/2018 CLINICAL DATA:  Encephalopathy.  Abnormal movements. EXAM: CT HEAD WITHOUT CONTRAST TECHNIQUE: Contiguous axial images were obtained from the base of the skull through the vertex without intravenous contrast. COMPARISON:  None. FINDINGS: Brain: The study is degraded by patient motion. Multiple attempts were made. There is less motion on the second attempt. No acute infarct, hemorrhage, or mass lesion is present. The ventricles are of normal size. No significant white matter lesions are present. No significant extraaxial fluid collection is present. Vascular: No hyperdense vessel or unexpected calcification. Skull: Calvarium is intact. No focal lytic or blastic lesions are present. Sinuses/Orbits: The paranasal sinuses and mastoid air cells are clear. The globes and orbits are within normal limits. IMPRESSION: Normal CT of the head. Electronically Signed   By: San Morelle M.D.   On: 06/12/2018 16:47    EKG:  No orders found for this or any previous visit.  IMPRESSION AND PLAN:   58 year old female with severe depression/PTSD who presents via EMS due to altered mental status.  1. Acute encephalopathy: Patient has had 2 episodes previous to this episode with similar issue and thought to be due to narcotic withdrawal. CT head  negative. Follow-up on UA Follow-up on ammonia level, RPR, TSH, B12 and B1 EEG ordered Consultation with neurology requested Restart pain medications  2.  PTSD: Continue Cymbalta  3.  COPD without signs exacerbation: Continue inhalers  4.  Elevated white blood cell count: Follow-up UA and repeat CBC in a.m.  5.  Hypokalemia: Replete and recheck in a.m.  All the records are reviewed and case discussed with ED provider. CODE STATUS: full  TOTAL TIME TAKING CARE OF THIS PATIENT: 43 minutes.    Masaye Gatchalian M.D on 06/12/2018 at 5:46 PM  Between 7am to 6pm - Pager - [phone]  After 6pm go to [email or web address] - password [email or web address] [facility]  Office  [phone]  CC: Primary care physician; Charlann Boxer, MD

## 2018-06-12 NOTE — ED Notes (Addendum)
Patient used restroom at this time. Stand by assist needed. Non slip socks were applied to patient's feet before patient began ambulation. Patient was able to provide peri care her self.

## 2018-06-12 NOTE — ED Notes (Signed)
Pt is still anxious and waling around in the bed. Sitter at bedside as family left  Family had requested pt to be placed as she lives 1.5 away and cant provide the care that the pt needs at this time.

## 2018-06-12 NOTE — ED Notes (Addendum)
This sitter at bedside with patient. Patient is restless at this time.

## 2018-06-12 NOTE — ED Notes (Signed)
Patient sitting on side of the bed at this moment but still restless.

## 2018-06-12 NOTE — ED Notes (Signed)
Patient is restless. Moving around in bed

## 2018-06-12 NOTE — ED Triage Notes (Signed)
Brought by ems for sleeping too much.  Vss.  Is on morphine for back pain, but family said she is not taking her meds because she is sleeping.  Ems states pateint refuses to answer questions.  She is in wheelchair, alert, some jerky movements--restless movements.

## 2018-06-12 NOTE — ED Notes (Signed)
Verbal order received to give pt morphine for pain at this time.

## 2018-06-12 NOTE — ED Notes (Signed)
This sitter relieved present sitter [place], [place]. Patient is awake. Patient is restless in bed at this moment.

## 2018-06-12 NOTE — ED Notes (Signed)
This sitter at bedside. Patient calm but restless.

## 2018-06-12 NOTE — ED Notes (Signed)
Patient restless. Sitter present at bed side

## 2018-06-12 NOTE — ED Triage Notes (Signed)
Pt in via [facility] from home; pt restless, pupils dilated, unable to answer my questions.  Vitals WDL.

## 2018-06-13 ENCOUNTER — Other Ambulatory Visit: Payer: Medicare Other

## 2018-06-13 ENCOUNTER — Encounter: Payer: Self-pay | Admitting: Radiology

## 2018-06-13 ENCOUNTER — Inpatient Hospital Stay: Payer: Medicare Other

## 2018-06-13 DIAGNOSIS — G9340 Encephalopathy, unspecified: Secondary | ICD-10-CM

## 2018-06-13 LAB — CBC
HCT: 41.1 % (ref 36.0–46.0)
HEMOGLOBIN: 15 g/dL (ref 12.0–15.0)
MCH: 31.3 pg (ref 26.0–34.0)
MCHC: 36.5 g/dL — ABNORMAL HIGH (ref 30.0–36.0)
MCV: 85.6 fL (ref 80.0–100.0)
Platelets: 285 10*3/uL (ref 150–400)
RBC: 4.8 MIL/uL (ref 3.87–5.11)
RDW: 11.8 % (ref 11.5–15.5)
WBC: 13.8 10*3/uL — ABNORMAL HIGH (ref 4.0–10.5)
nRBC: 0 % (ref 0.0–0.2)

## 2018-06-13 LAB — GLUCOSE, CAPILLARY: Glucose-Capillary: 119 mg/dL — ABNORMAL HIGH (ref 70–99)

## 2018-06-13 LAB — BASIC METABOLIC PANEL
Anion gap: 13 (ref 5–15)
BUN: 18 mg/dL (ref 6–20)
CO2: 22 mmol/L (ref 22–32)
Calcium: 9.3 mg/dL (ref 8.9–10.3)
Chloride: 105 mmol/L (ref 98–111)
Creatinine, Ser: 0.64 mg/dL (ref 0.44–1.00)
GFR calc Af Amer: >60 mL/min (ref >60)
GFR calc non Af Amer: >60 mL/min (ref >60)
Glucose, Bld: 142 mg/dL — ABNORMAL HIGH (ref 70–99)
POTASSIUM: 3.1 mmol/L — AB (ref 3.5–5.1)
Sodium: 140 mmol/L (ref 135–145)

## 2018-06-13 LAB — VITAMIN B12: Vitamin B-12: 310 pg/mL (ref 180–914)

## 2018-06-13 LAB — TSH: TSH: 0.071 u[IU]/mL — ABNORMAL LOW (ref 0.350–4.500)

## 2018-06-13 LAB — T4, FREE: Free T4: 0.79 ng/dL — ABNORMAL LOW (ref 0.82–1.77)

## 2018-06-13 LAB — TECHNOLOGIST SMEAR REVIEW: Plt Morphology: NONE SEEN

## 2018-06-13 LAB — AMMONIA: AMMONIA: 10 umol/L (ref 9–35)

## 2018-06-13 MED ORDER — NALOXEGOL OXALATE 25 MG PO TABS
25.0000 mg | ORAL_TABLET | Freq: Every day | ORAL | Status: DC
  Administered 2018-06-14: 25 mg via ORAL
  Filled 2018-06-13 (×3): qty 1

## 2018-06-13 MED ORDER — DIVALPROEX SODIUM 500 MG PO DR TAB
500.0000 mg | DELAYED_RELEASE_TABLET | Freq: Two times a day (BID) | ORAL | Status: DC
  Administered 2018-06-14: 500 mg via ORAL
  Filled 2018-06-13 (×2): qty 1

## 2018-06-13 MED ORDER — VALPROATE SODIUM 500 MG/5ML IV SOLN
500.0000 mg | Freq: Once | INTRAVENOUS | Status: AC
  Administered 2018-06-13: 500 mg via INTRAVENOUS
  Filled 2018-06-13: qty 5

## 2018-06-13 MED ORDER — ESCITALOPRAM OXALATE 10 MG PO TABS
20.0000 mg | ORAL_TABLET | Freq: Every day | ORAL | Status: DC
  Administered 2018-06-13 – 2018-06-14 (×2): 20 mg via ORAL
  Filled 2018-06-13 (×2): qty 2

## 2018-06-13 MED ORDER — IOHEXOL 300 MG/ML  SOLN
75.0000 mL | Freq: Once | INTRAMUSCULAR | Status: AC | PRN
  Administered 2018-06-13: 75 mL via INTRAVENOUS

## 2018-06-13 MED ORDER — POTASSIUM CHLORIDE CRYS ER 20 MEQ PO TBCR
40.0000 meq | EXTENDED_RELEASE_TABLET | Freq: Once | ORAL | Status: AC
  Administered 2018-06-13: 40 meq via ORAL
  Filled 2018-06-13: qty 2

## 2018-06-13 NOTE — ED Notes (Signed)
Patient went to restroom at this time. Stand by assist needed. Non-slip yellow socks applied to patient before ambulating. Patient had a BM and urinated. Patient back lying on stretcher. This sitter is still present at bed side.

## 2018-06-13 NOTE — Progress Notes (Signed)
[facility] NAME: Rebecca Colon    MR#:  [id number]  DATE OF BIRTH:  Aug 13, 1960  SUBJECTIVE:  CHIEF COMPLAINT:   Chief Complaint  Patient presents with  . Altered Mental Status    Patient still very confused this morning.  Awake and alert but very slow to respond or answer questions.  Nursing staff discussed extensively with patient's daughter this morning who confirmed that patient was able to live independently prior to this episode.  Appears to have been admitted twice this year with similar episode.  At that time felt to be related to patient taking multiple medications and felt to be related to withdrawal from narcotics. Patient currently moving all extremities with no focal deficit.  REVIEW OF SYSTEMS:  ROS  Unobtainable due to underlying medical condition.  DRUG ALLERGIES:   Allergies  Allergen Reactions  . Other Hives    Significant skin reaction to Vicryl suture  . Penicillins Anaphylaxis and Other (See Comments)    **Patient received ceftriaxone 1g IV in ED on 07/08/17 w/o ADR Has patient had a PCN reaction causing immediate rash, facial/tongue/throat swelling, SOB or lightheadedness with hypotension: Yes Has patient had a PCN reaction causing severe rash involving mucus membranes or skin necrosis: Unknown Has patient had a PCN reaction that required hospitalization: Yes Has patient had a PCN reaction occurring within the last 10 years: No If all above answers are "NO", then may proceed with Cephalosporin use.  . Tape Rash   VITALS:  Blood pressure 119/62, pulse 67, temperature 98.3 F (36.8 C), temperature source Oral, resp. rate 18, height 5\' 7"  (1.702 m), weight 90 kg, SpO2 98 %. PHYSICAL EXAMINATION:   Physical Exam  Constitutional: She appears well-developed and well-nourished.  Patient confused and not very comfortable laying in bed.  HENT:  Head: Normocephalic and atraumatic.  Mouth/Throat: Oropharynx is  clear and moist.  Eyes: Pupils are equal, round, and reactive to light. Conjunctivae and EOM are normal. Right eye exhibits no discharge.  Neck: Normal range of motion. Neck supple. No tracheal deviation present.  Cardiovascular: Normal rate, regular rhythm and normal heart sounds.  Respiratory: Effort normal. No respiratory distress. She has no wheezes.  GI: Soft. Bowel sounds are normal. There is no abdominal tenderness.  Musculoskeletal: Normal range of motion.        General: No edema.  Neurological:  Patient confused.  Oriented x1.  Moving all extremities with no focal deficit.   Skin: Skin is warm. No erythema.  Psychiatric: She has a normal mood and affect.  Patient confused.  Poor insight.   LABORATORY PANEL:  Female CBC Recent Labs  Lab 06/13/18 0609  WBC 13.8*  HGB 15.0  HCT 41.1  PLT 285   ------------------------------------------------------------------------------------------------------------------ Chemistries  Recent Labs  Lab 06/12/18 1527 06/13/18 0609  NA 140 140  K 3.0* 3.1*  CL 104 105  CO2 23 22  GLUCOSE 147* 142*  BUN 16 18  CREATININE 0.64 0.64  CALCIUM 9.9 9.3  AST 48*  --   ALT 41  --   ALKPHOS 82  --   BILITOT 1.4*  --    RADIOLOGY:  Ct Head Wo Contrast  Result Date: 06/12/2018 CLINICAL DATA:  Encephalopathy.  Abnormal movements. EXAM: CT HEAD WITHOUT CONTRAST TECHNIQUE: Contiguous axial images were obtained from the base of the skull through the vertex without intravenous contrast. COMPARISON:  None. FINDINGS: Brain: The study is degraded by patient motion. Multiple attempts  were made. There is less motion on the second attempt. No acute infarct, hemorrhage, or mass lesion is present. The ventricles are of normal size. No significant white matter lesions are present. No significant extraaxial fluid collection is present. Vascular: No hyperdense vessel or unexpected calcification. Skull: Calvarium is intact. No focal lytic or blastic lesions  are present. Sinuses/Orbits: The paranasal sinuses and mastoid air cells are clear. The globes and orbits are within normal limits. IMPRESSION: Normal CT of the head. Electronically Signed   By: San Morelle M.D.   On: 06/12/2018 16:47   ASSESSMENT AND PLAN:   1. Acute encephalopathy: Patient has had 2 episodes previous to this episode with similar issue and thought to be due to narcotic withdrawal. CT head negative.  Urinalysis negative.  Vitamin B12 level 310.  Ammonia level normal at 10.  TSH level low at 0.071.  Patient not on thyroid replacement.  Requested for T4 and free T4 level. Patient seen by neurology service.  EEG and MRI of the brain requested. Later noted patient to have significant improvement compared to this morning.  Patient more awake and alert done seen having a conversation with nursing staff at the nursing station. High clinical suspicion of polypharmacy and possibly medication related. Urine drug screen was positive for opiates which patient takes at home. Will discuss plan of care further with neurology service  2.  PTSD: Continue Cymbalta  3.  COPD without signs exacerbation: Continue inhalers  4.  Elevated white blood cell count: Follow-up UA and repeat CBC in a.m.  5.  Hypokalemia: Replete and recheck in a.m.   DVT prophylaxis; Lovenox  All the records are reviewed and case discussed with Care Management/Social Worker. Management plans discussed with the patient, family and they are in agreement.  CODE STATUS: Full Code  TOTAL TIME TAKING CARE OF THIS PATIENT: 35 minutes.   More than 50% of the time was spent in counseling/coordination of care: YES  POSSIBLE D/C IN 2 DAYS, DEPENDING ON CLINICAL CONDITION.   Alima Naser M.D on 06/13/2018 at 1:15 PM  Between 7am to 6pm - Pager - [phone]  After 6pm go to [email or web address] - Proofreader  [employer] [facility]  Office  [phone]  CC: Primary care physician;  Charlann Boxer, MD  Note: This dictation was prepared with Dragon dictation along with smaller phrase technology. Any transcriptional errors that result from this process are unintentional.

## 2018-06-13 NOTE — Progress Notes (Signed)
eeg completed ° °

## 2018-06-13 NOTE — Consult Note (Addendum)
Reason for Consult:Altered mental status Referring Physician: Otila Back MD  CC: Altered mental status  HPI: Rebecca Colon is an 58 y.o. female with past medical history of anxiety, depression, breast cancer, compartment syndrome of left lower extremity, DVT, sciatic nerve pain, fibromyalgia, PTSD, and obstructive sleep apnea presenting to the ED on 06/12/2018 with altered mental status. Patient is altered therefore history obtained from patient's chart.  Per ED report, patient was brought in by EMS for excessive sleepiness, restlessness, altered mental status and agitation. On arrival to the ED, she was afebrile with blood pressure 120/82 mm Hg and pulse rate 83 beats/min. There were no focal neurological deficits; however she was encephalopathic and unable to answer orientation question.  Initial labs revealed calcium 3.0, blood glucose 147, AST 48, ALT 41, WBC 16.4, thyroid-stimulating hormone (TSH) -reflex 0.071, urinalysis, drug of abuse screen were unremarkable; an insidious infectious workup was initiated, including rapid HIV, RPR, and Lyme reflex. ECG showed sinus rhythm of 51 beats per minute. A non-contrast head CT showed no acute intracranial abnormality.  Patient has been evaluated for similar presentation 3 times this year, during the last admission patient was noted to have elevated white count of 37.4, CT chest at that time showed extensive fluid around the right breast implant with edema in the adjacent soft tissue concerning for an abscess around the implant, there was also abnormal irregular soft tissue density in the right axilla and right lateral chest wall, cellulitis and tumor could not be excluded.  Several small right axillary lymph node also noted.  Patient continues to be encephalopathic, restless and agitated on exam.  Past Medical History:  Diagnosis Date  . Anemia   . Anxiety   . Asthma   . Breast cancer ([place])   . Compartment syndrome of left lower extremity ([place])   .  Depression   . DVT (deep venous thrombosis) ([place])   . Sciatic nerve injury     Past Surgical History:  Procedure Laterality Date  . CERVICAL FUSION    . FASCIOTOMY Left   . LUMBAR FUSION    . MASTECTOMY    . SPINAL CORD STIMULATOR IMPLANT    . TONSILLECTOMY      Family History  Problem Relation Age of Onset  . COPD Mother   . Heart disease Mother   . Hypertension Mother   . COPD Father   . Heart disease Father   . Hypertension Father     Social History:  reports that she quit smoking about 5 years ago. She has never used smokeless tobacco. She reports current alcohol use. She reports that she does not use drugs.  Allergies  Allergen Reactions  . Other Hives    Significant skin reaction to Vicryl suture  . Penicillins Anaphylaxis and Other (See Comments)    **Patient received ceftriaxone 1g IV in ED on 07/08/17 w/o ADR Has patient had a PCN reaction causing immediate rash, facial/tongue/throat swelling, SOB or lightheadedness with hypotension: Yes Has patient had a PCN reaction causing severe rash involving mucus membranes or skin necrosis: Unknown Has patient had a PCN reaction that required hospitalization: Yes Has patient had a PCN reaction occurring within the last 10 years: No If all above answers are "NO", then may proceed with Cephalosporin use.  . Tape Rash    Medications:  I have reviewed the patient's current medications. Prior to Admission:  Medications Prior to Admission  Medication Sig Dispense Refill Last Dose  . ADVAIR DISKUS 500-50 MCG/DOSE AEPB  Inhale 1 puff into the lungs 2 (two) times daily.  6 unknown at unknown  . albuterol (PROVENTIL HFA;VENTOLIN HFA) 108 (90 Base) MCG/ACT inhaler Inhale 2 puffs into the lungs every 6 (six) hours as needed for wheezing or shortness of breath.   prn at prn  . baclofen (LIORESAL) 20 MG tablet Take 20 mg by mouth every 8 (eight) hours as needed for muscle spasms.    prn at prn  . DULoxetine (CYMBALTA) 60 MG capsule  Take 60 mg by mouth at bedtime.   unknown at unknown  . escitalopram (LEXAPRO) 20 MG tablet Take 20 mg by mouth daily.   unknown at unknown  . montelukast (SINGULAIR) 10 MG tablet Take 10 mg by mouth daily.   unknown at unknown  . morphine (MS CONTIN) 60 MG 12 hr tablet Take 60 mg by mouth every 8 (eight) hours.   unknown at unknown  . morphine (MSIR) 30 MG tablet Take 30 mg by mouth 2 (two) times daily as needed for severe pain.   unknown at unknown  . MOVANTIK 25 MG TABS tablet Take 25 mg by mouth daily.  1 unknown at unknown  . pregabalin (LYRICA) 200 MG capsule Take 200 mg by mouth 3 (three) times daily.   unknown at unknown  . tiotropium (SPIRIVA) 18 MCG inhalation capsule Place 18 mcg into inhaler and inhale daily.   unknown at unknown   Scheduled: . docusate sodium  100 mg Oral BID  . DULoxetine  60 mg Oral QHS  . enoxaparin (LOVENOX) injection  40 mg Subcutaneous Q24H  . mometasone-formoterol  2 puff Inhalation BID  . montelukast  10 mg Oral Daily  . morphine  60 mg Oral Q8H  . potassium chloride  40 mEq Oral Once  . pregabalin  200 mg Oral TID  . tiotropium  18 mcg Inhalation Daily    ROS: History obtained from the patient   General ROS: negative for - chills, fatigue, fever, night sweats, weight gain or weight loss Psychological ROS: negative for - behavioral disorder, hallucinations, memory difficulties, mood swings or suicidal ideation Ophthalmic ROS: negative for - blurry vision, double vision, eye pain or loss of vision ENT ROS: negative for - epistaxis, nasal discharge, oral lesions, sore throat, tinnitus or vertigo Allergy and Immunology ROS: negative for - hives or itchy/watery eyes Hematological and Lymphatic ROS: negative for - bleeding problems, bruising or swollen lymph nodes Endocrine ROS: negative for - galactorrhea, hair pattern changes, polydipsia/polyuria or temperature intolerance Respiratory ROS: negative for - cough, hemoptysis, shortness of breath or  wheezing Cardiovascular ROS: negative for - chest pain, dyspnea on exertion, edema or irregular heartbeat Gastrointestinal ROS: negative for - abdominal pain, diarrhea, hematemesis, nausea/vomiting or stool incontinence Genito-Urinary ROS: negative for - dysuria, hematuria, incontinence or urinary frequency/urgency Musculoskeletal ROS: negative for - joint swelling or muscular weakness Neurological ROS: as noted in HPI Dermatological ROS: negative for rash and skin lesion changes  Physical Examination: Blood pressure 119/62, pulse 67, temperature 98.3 F (36.8 C), temperature source Oral, resp. rate 18, height 5\' 7"  (1.702 m), weight 90 kg, SpO2 98 %.  HEENT-  Normocephalic, no lesions, without obvious abnormality.  Normal external eye and conjunctiva.  Normal TM's bilaterally.  Normal auditory canals and external ears. Normal external nose, mucus membranes and septum.  Normal pharynx. Cardiovascular- S1, S2 normal, pulses palpable throughout   Lungs- chest clear, no wheezing, rales, normal symmetric air entry Abdomen- soft, non-tender; bowel sounds normal; no masses,  no  organomegaly Extremities- no edema Lymph-no adenopathy palpable Musculoskeletal-no joint tenderness, deformity or swelling Skin-warm and dry, no hyperpigmentation, vitiligo, or suspicious lesions  Neurological Exam   Mental Status: Alert, oriented to person and place but not to time and situation, thought content mildly inappropriate appropriate.  Speech fluent without evidence of aphasia.  Able to follow 3 step commands without difficulty. Attention span and concentration seemed inappropriate.  Appears restless requiring redirection and multiple prompting. Cranial Nerves: II: Discs flat bilaterally; Visual fields grossly normal, pupils equal, round, reactive to light and accommodation III,IV, VI: ptosis not present, extra-ocular motions intact bilaterally V,VII: smile symmetric, facial light touch sensation  intact VIII: hearing normal bilaterally IX,X: gag reflex present XI: bilateral shoulder shrug XII: midline tongue extension Motor: Right :  Upper extremity   5/5 Without pronator drift      Left: Upper extremity   5/5 without pronator drift Right:   Lower extremity   5/5                                          Left: Lower extremity   5/5 Tone and bulk:normal tone throughout; no atrophy noted Sensory: Pinprick and light touch intact bilaterally Deep Tendon Reflexes: 2+ and symmetric throughout Plantars: Unable to assess patient not cooperative. Cerebellar: Finger-to-nose testing intact bilaterally. Heel to shin testing normal bilaterally Gait: not tested due to safety concerns  Data Reviewed  Laboratory Studies:   Basic Metabolic Panel: Recent Labs  Lab 06/12/18 1527 06/13/18 0609  NA 140 140  K 3.0* 3.1*  CL 104 105  CO2 23 22  GLUCOSE 147* 142*  BUN 16 18  CREATININE 0.64 0.64  CALCIUM 9.9 9.3    Liver Function Tests: Recent Labs  Lab 06/12/18 1527  AST 48*  ALT 41  ALKPHOS 82  BILITOT 1.4*  PROT 8.2*  ALBUMIN 5.0   No results for input(s): LIPASE, AMYLASE in the last 168 hours. No results for input(s): AMMONIA in the last 168 hours.  CBC: Recent Labs  Lab 06/12/18 1528 06/13/18 0609  WBC 16.4* 13.8*  NEUTROABS 13.7*  --   HGB 15.4* 15.0  HCT 41.8 41.1  MCV 83.9 85.6  PLT 289 285    Cardiac Enzymes: No results for input(s): CKTOTAL, CKMB, CKMBINDEX, TROPONINI in the last 168 hours.  BNP: Invalid input(s): POCBNP  CBG: Recent Labs  Lab 06/13/18 0333  GLUCAP 119*    Microbiology: Results for orders placed or performed during the hospital encounter of 11/11/17  Culture, blood (routine x 2)     Status: None   Collection Time: 11/11/17  9:42 PM  Result Value Ref Range Status   Specimen Description BLOOD LEFT HAND  Final   Special Requests   Final    BOTTLES DRAWN AEROBIC AND ANAEROBIC Blood Culture adequate volume   Culture   Final     NO GROWTH 5 DAYS Performed at [facility], [address], [place], [place] [address]    Report Status 11/16/2017 FINAL  Final  Culture, blood (routine x 2)     Status: None   Collection Time: 11/11/17 10:20 PM  Result Value Ref Range Status   Specimen Description BLOOD LEFT ANTECUBITAL  Final   Special Requests   Final    BOTTLES DRAWN AEROBIC AND ANAEROBIC Blood Culture adequate volume   Culture   Final    NO GROWTH 5  DAYS Performed at [facility], [address] [place], [place] [address]    Report Status 11/16/2017 FINAL  Final  Urine Culture     Status: Abnormal   Collection Time: 11/11/17 10:47 PM  Result Value Ref Range Status   Specimen Description   Final    URINE, RANDOM Performed at [facility], [address] [place], [place] [address]    Special Requests   Final    NONE Performed at [facility], [address] [place], [place] [address]    Culture (A)  Final    <10,000 COLONIES/mL INSIGNIFICANT GROWTH Performed at [facility], [place] [address], [place], [place] [address]    Report Status 11/13/2017 FINAL  Final    Coagulation Studies: No results for input(s): LABPROT, INR in the last 72 hours.  Urinalysis:  Recent Labs  Lab 06/12/18 1730  COLORURINE AMBER*  LABSPEC 1.031*  PHURINE 5.0  GLUCOSEU NEGATIVE  HGBUR MODERATE*  BILIRUBINUR NEGATIVE  KETONESUR 80*  PROTEINUR 100*  NITRITE NEGATIVE  LEUKOCYTESUR NEGATIVE    Lipid Panel:  No results found for: CHOL, TRIG, HDL, CHOLHDL, VLDL, LDLCALC  HgbA1C: No results found for: HGBA1C  Urine Drug Screen:      Component Value Date/Time   LABOPIA POSITIVE (A) 06/12/2018 1730   COCAINSCRNUR NONE DETECTED 06/12/2018 1730   LABBENZ NONE DETECTED 06/12/2018 1730   AMPHETMU NONE DETECTED 06/12/2018 1730   THCU NONE DETECTED 06/12/2018 1730   LABBARB NONE DETECTED 06/12/2018 1730    Alcohol Level:  Recent Labs  Lab 06/12/18 1528  ETH <10     Other results: EKG: there are no previous tracings available for comparison.  Imaging: Ct Head Wo Contrast  Result Date: 06/12/2018 CLINICAL DATA:  Encephalopathy.  Abnormal movements. EXAM: CT HEAD WITHOUT CONTRAST TECHNIQUE: Contiguous axial images were obtained from the base of the skull through the vertex without intravenous contrast. COMPARISON:  None. FINDINGS: Brain: The study is degraded by patient motion. Multiple attempts were made. There is less motion on the second attempt. No acute infarct, hemorrhage, or mass lesion is present. The ventricles are of normal size. No significant white matter lesions are present. No significant extraaxial fluid collection is present. Vascular: No hyperdense vessel or unexpected calcification. Skull: Calvarium is intact. No focal lytic or blastic lesions are present. Sinuses/Orbits: The paranasal sinuses and mastoid air cells are clear. The globes and orbits are within normal limits. IMPRESSION: Normal CT of the head. Electronically Signed   By: San Morelle M.D.   On: 06/12/2018 16:47   Patient seen and examined.  Clinical course and management discussed.  Necessary edits performed.  I agree with the above.  Assessment and plan of care developed and discussed below.    Assessment: 58 year old female with past medical history of anxiety, depression, breast cancer, compartment syndrome of left lower extremity, DVT, sciatic nerve pain, fibromyalgia, PTSD, and obstructive sleep apnea presenting to the ED on 06/12/2018 with altered mental status. She is recently admitted twice for similar episode which was thought to be related to her medication.  Work-up at that time revealed normal CT head, however CT chest showed extensive fluid around the right breast implant with edema in the adjacent soft tissue concerning for an abscess around the implant, there was also abnormal irregular soft tissue density in the right axilla and right lateral chest wall,  cellulitis and tumor could not be excluded.  Unclear if this was the source of her  elevated white count of 37.4 at that time.  Patient remains encephalopathic with elevated white count of 16.4 now improved to 13.8. Patient has progressively improved as the day has progressed and although her medication use may very well be causing her presentation, it is reasonable to resolve some of these medical issues that may be contributing to her recurrent presentations as well.    Plan: 1. MRI Brain with and without contrast 2. Recommend follow up of abnormal TSH  3. Recommend repeat CT chest with contrast 4. Check labs: Peripheral blood smear, HIV, Lyme reflex 5. EEG 6. If possible medication bottles to be brought in for confirmation on use. 7. ABG  This patient was staffed with Dr. Magda Paganini, Doy Mince who personally evaluated patient, reviewed documentation and agreed with assessment and plan of care as above.  Rufina Falco, DNP, FNP-BC Board certified Nurse Practitioner Neurology Department   06/13/2018, 12:23 PM   Alexis Goodell, MD Neurology [phone]  06/13/2018  2:28 PM

## 2018-06-13 NOTE — ED Notes (Signed)
Sitter at bedside. Patient is restless at this moment. Constantly moving around in bed. Patient is calm.

## 2018-06-13 NOTE — ED Notes (Signed)
Patient is restless at this time

## 2018-06-13 NOTE — Procedures (Signed)
ELECTROENCEPHALOGRAM REPORT   Patient: Rebecca Colon       Room #: 136A-AA EEG No. ID: [id number] Age: 58 y.o.        Sex: female Referring Physician: Stark Jock Report Date:  06/13/2018        Interpreting Physician: Alexis Goodell  History: Rebecca Colon is an 58 y.o. female with altered mental status  Medications:  Colace, Cymbalta, Lexapro, Dulera, Singulair, MS Contin, Movantik, K-dur, Lyrica, Spiriva  Conditions of Recording:  This is a 21 channel routine scalp EEG performed with bipolar and monopolar montages arranged in accordance to the international 10/20 system of electrode placement. One channel was dedicated to EKG recording.  The patient is in the awake state.  Description:  The waking background activity consists of a low voltage, symmetrical, fairly well organized, 10 Hz alpha activity, seen from the parieto-occipital and posterior temporal regions.  Low voltage fast activity, poorly organized, is seen anteriorly and is at times superimposed on more posterior regions.  A mixture of theta and alpha rhythms are seen from the central and temporal regions.  There are frequent bursts of bifrontal spike and slow wave activity lasting up to 5 seconds.  This spike and slow wave activity occurs at a frequency of 2-3 Hz.  There is no change in clinical activity with these bursts.   The patient does not drowse or sleep. Hyperventilation was not performed.  Intermittent photic stimulation was performed but failed to illicit any change in the tracing.    IMPRESSION: This is an abnormal electroencephalogram secondary to bifrontal spike and slow wave activity at a frequency of 2-3 Hz occurring in bursts lasting up to five seconds.     Alexis Goodell, MD Neurology [phone] 06/13/2018, 7:08 PM

## 2018-06-13 NOTE — Progress Notes (Signed)
Family brought CPAP to bedside, Rebecca Colon with respiratory notified.

## 2018-06-14 LAB — CBC
HCT: 40.2 % (ref 36.0–46.0)
HEMOGLOBIN: 14.5 g/dL (ref 12.0–15.0)
MCH: 31.3 pg (ref 26.0–34.0)
MCHC: 36.1 g/dL — ABNORMAL HIGH (ref 30.0–36.0)
MCV: 86.6 fL (ref 80.0–100.0)
Platelets: 271 10*3/uL (ref 150–400)
RBC: 4.64 MIL/uL (ref 3.87–5.11)
RDW: 12 % (ref 11.5–15.5)
WBC: 9.8 10*3/uL (ref 4.0–10.5)
nRBC: 0 % (ref 0.0–0.2)

## 2018-06-14 LAB — BASIC METABOLIC PANEL
ANION GAP: 9 (ref 5–15)
BUN: 13 mg/dL (ref 6–20)
CHLORIDE: 108 mmol/L (ref 98–111)
CO2: 23 mmol/L (ref 22–32)
Calcium: 8.8 mg/dL — ABNORMAL LOW (ref 8.9–10.3)
Creatinine, Ser: 0.66 mg/dL (ref 0.44–1.00)
GFR calc Af Amer: >60 mL/min (ref >60)
GFR calc non Af Amer: >60 mL/min (ref >60)
Glucose, Bld: 102 mg/dL — ABNORMAL HIGH (ref 70–99)
POTASSIUM: 2.8 mmol/L — AB (ref 3.5–5.1)
Sodium: 140 mmol/L (ref 135–145)

## 2018-06-14 LAB — MAGNESIUM: Magnesium: 2 mg/dL (ref 1.7–2.4)

## 2018-06-14 LAB — HIV ANTIBODY (ROUTINE TESTING W REFLEX): HIV Screen 4th Generation wRfx: NONREACTIVE

## 2018-06-14 LAB — T4: T4, Total: 9 ug/dL (ref 4.5–12.0)

## 2018-06-14 LAB — RPR: RPR Ser Ql: NONREACTIVE

## 2018-06-14 LAB — POTASSIUM: Potassium: 2.8 mmol/L — ABNORMAL LOW (ref 3.5–5.1)

## 2018-06-14 LAB — VALPROIC ACID LEVEL: VALPROIC ACID LVL: 28 ug/mL — AB (ref 50.0–100.0)

## 2018-06-14 MED ORDER — POTASSIUM CHLORIDE CRYS ER 20 MEQ PO TBCR
40.0000 meq | EXTENDED_RELEASE_TABLET | Freq: Once | ORAL | Status: AC
  Administered 2018-06-14: 40 meq via ORAL
  Filled 2018-06-14: qty 2

## 2018-06-14 MED ORDER — DIVALPROEX SODIUM 500 MG PO DR TAB: 500.0000 mg | DELAYED_RELEASE_TABLET | Freq: Two times a day (BID) | ORAL | 0 refills | Status: DC

## 2018-06-14 NOTE — Care Management Important Message (Signed)
Important Message  Patient Details  Name: Rebecca Colon MRN: [id number] Date of Birth: [birth year]   Medicare Important Message Given:  Yes    [name] 06/14/2018, 11:35 AM

## 2018-06-14 NOTE — Discharge Summary (Signed)
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 female with a known history of PTSD with severe depression presented to the emergency room via EMS due to altered mental status. Patient was found by her daughter covered in feces.  The last time her daughter spoke with her was Sunday.  Patient is very confused and unable to obtain HPI from the patient.  HPI is taken from the ER physician and EMS notes.Patient is on large amounts of narcotics on a daily basis.  She has had 3 episodes of similar issues this year in 2020.  They thought that it is from withdrawal of her narcotic. In the emergency room head CT was negative.  UA neg.  Chest x-ray shows no active source of infection.  Hospital course; 1.Acute metabolic encephalopathy: Multifactorial from newly diagnosed seizure and possible episodes of withdrawal from narcotics.Patient has had 2 episodes previous to this episode with similar issue and thought to be due to narcotic withdrawal. CT head negative.  Urinalysis negative.  Vitamin B12 level 310.  Ammonia level normal at 10.  TSH level low at 0.071 but T4 level was normal suggestive of subclinical hypothyroidism.. Patient seen by neurology service.  MRI of the brain could not be done  due to concern from radiology tech about patient having some form of implant for pain control.  MRI can be performed as outpatient if possible.  EEG was done which revealed abnormal electroencephalogram secondary to bifrontal spike and slow wave activity at a frequency of 2-3 Hz occurring in bursts lasting up to five second. Loaded with Depakote 500 mg IV once with maintenance dose of 500 mg BID.  Patient mental status improved, she is more alert today and oriented x4.  Concern is that patient may have been having some seizure activity which likely contributed to acute metabolic encephalopathy.  Patient clinically and hemodynamically stable. Patient strongly advised against driving or operating any heavy machinery for now until cleared by primary care physician or neurologist as outpatient given new diagnosis of seizure.  I informed both patient and daughter and they agree with recommendations.  2. PTSD: Continue Cymbalta  3. COPD without signs exacerbation: Continue inhalers  4. Elevated white blood cell count: Leukocytosis resolved.  Patient had CT chest done during this admission which was negative for any abscess.  5. Hypokalemia: This was appropriately replaced prior to discharge.  I called and updated patient's daughter over the phone on treatment and discharge plans today.  All questions were answered and she is in agreement with the plan of care. DISCHARGE CONDITIONS:  Stable CONSULTS OBTAINED:  Treatment Team:  Otila Back, MD Alexis Goodell, MD DRUG ALLERGIES:   Allergies  Allergen Reactions  Other Hives    Significant skin reaction to Vicryl suture   Penicillins Anaphylaxis and Other (See Comments)    **Patient received ceftriaxone 1g IV in ED on 07/08/17 w/o ADR Has patient had a PCN reaction causing immediate rash, facial/tongue/throat swelling, SOB or lightheadedness with hypotension: Yes Has patient had a PCN reaction causing severe rash involving mucus membranes  or skin necrosis: Unknown Has patient had a PCN reaction that required hospitalization: Yes Has patient had a PCN reaction occurring within the last 10 years: No If all above answers are "NO", then may proceed with Cephalosporin use.   Tape Rash   DISCHARGE MEDICATIONS:   Allergies as of 06/14/2018      Reactions   Other Hives   Significant skin reaction to Vicryl suture   Penicillins Anaphylaxis, Other (See Comments)   **Patient received ceftriaxone 1g IV in ED on 07/08/17 w/o ADR Has patient had a PCN reaction causing immediate rash, facial/tongue/throat swelling, SOB or lightheadedness with hypotension: Yes Has patient had a PCN reaction causing severe rash involving mucus membranes or skin necrosis: Unknown Has patient had a PCN reaction that required hospitalization: Yes Has patient had a PCN reaction occurring within the last 10 years: No If all above answers are "NO", then may proceed with Cephalosporin use.   Tape Rash      Medication List    TAKE these medications   Advair Diskus 500-50 MCG/DOSE Aepb Generic drug:  Fluticasone-Salmeterol Inhale 1 puff into the lungs 2 (two) times daily.   albuterol 108 (90 Base) MCG/ACT inhaler Commonly known as:  PROVENTIL HFA;VENTOLIN HFA Inhale 2 puffs into the lungs every 6 (six) hours as needed for wheezing or shortness of breath.   baclofen 20 MG tablet Commonly known as:  LIORESAL Take 20 mg by mouth every 8 (eight) hours as needed for muscle spasms.   divalproex 500 MG DR tablet Commonly known as:  DEPAKOTE Take 1 tablet (500 mg total) by mouth every 12 (twelve) hours.   DULoxetine 60 MG capsule Commonly known as:  CYMBALTA Take 60 mg by mouth at bedtime.   escitalopram 20 MG tablet Commonly known as:  LEXAPRO Take 20 mg by mouth daily.   montelukast 10 MG tablet Commonly known as:  SINGULAIR Take 10 mg by mouth daily.   morphine 30 MG tablet Commonly known as:  MSIR Take 30 mg by mouth 2 (two) times daily as  needed for severe pain.   morphine 60 MG 12 hr tablet Commonly known as:  MS CONTIN Take 60 mg by mouth every 8 (eight) hours.   Movantik 25 MG Tabs tablet Generic drug:  naloxegol oxalate Take 25 mg by mouth daily.   pregabalin 200 MG capsule Commonly known as:  LYRICA Take 200 mg by mouth 3 (three) times daily.   tiotropium 18 MCG inhalation capsule Commonly known as:  SPIRIVA Place 18 mcg into inhaler and inhale daily.        DISCHARGE INSTRUCTIONS:   DIET:  Cardiac diet DISCHARGE CONDITION:  Stable ACTIVITY:  Activity as tolerated OXYGEN:  Home Oxygen: No.  Oxygen Delivery: room air DISCHARGE LOCATION:  home   If you experience worsening of your admission symptoms, develop shortness of breath, life threatening emergency, suicidal or homicidal thoughts you must seek medical attention immediately by calling 911 or calling your MD immediately  if symptoms less severe.  You Must read complete instructions/literature along with all the possible adverse reactions/side effects for all the Medicines you take and  that have been prescribed to you. Take any new Medicines after you have completely understood and accpet all the possible adverse reactions/side effects.   Please note  You were cared for by a hospitalist during your hospital stay. If you have any questions about your discharge medications or the care you received while you were in the hospital after you are discharged, you can call the unit and asked to speak with the hospitalist on call if the hospitalist that took care of you is not available. Once you are discharged, your primary care physician will handle any further medical issues. Please note that NO REFILLS for any discharge medications will be authorized once you are discharged, as it is imperative that you return to your primary care physician (or establish a relationship with a primary care physician if you do not have one) for your aftercare needs so that  they can reassess your need for medications and monitor your lab values.    On the day of Discharge:  VITAL SIGNS:  Blood pressure 133/78, pulse (!) 49, temperature 97.9 F (36.6 C), temperature source Oral, resp. rate 20, height 5\' 7"  (1.702 m), weight 90 kg, SpO2 99 %. PHYSICAL EXAMINATION:  GENERAL:  58 y.o.-year-old patient lying in the bed with no acute distress.  EYES: Pupils equal, round, reactive to light and accommodation. No scleral icterus. Extraocular muscles intact.  HEENT: Head atraumatic, normocephalic. Oropharynx and nasopharynx clear.  NECK:  Supple, no jugular venous distention. No thyroid enlargement, no tenderness.  LUNGS: Normal breath sounds bilaterally, no wheezing, rales,rhonchi or crepitation. No use of accessory muscles of respiration.  CARDIOVASCULAR: S1, S2 normal. No murmurs, rubs, or gallops.  ABDOMEN: Soft, non-tender, non-distended. Bowel sounds present. No organomegaly or mass.  EXTREMITIES: No pedal edema, cyanosis, or clubbing.  NEUROLOGIC: Cranial nerves II through XII are intact. Muscle strength 5/5 in all extremities. Sensation intact. Gait not checked.  PSYCHIATRIC: The patient is alert and oriented x 3.  SKIN: No obvious rash, lesion, or ulcer.  DATA REVIEW:   CBC Recent Labs  Lab 06/14/18 0222  WBC 9.8  HGB 14.5  HCT 40.2  PLT 271    Chemistries  Recent Labs  Lab 06/12/18 1527  06/14/18 0222 06/14/18 0737  NA 140   < > 140  --   K 3.0*   < > 2.8* 2.8*  CL 104   < > 108  --   CO2 23   < > 23  --   GLUCOSE 147*   < > 102*  --   BUN 16   < > 13  --   CREATININE 0.64   < > 0.66  --   CALCIUM 9.9   < > 8.8*  --   MG  --   --  2.0  --   AST 48*  --   --   --   ALT 41  --   --   --   ALKPHOS 82  --   --   --   BILITOT 1.4*  --   --   --    < > = values in this interval not displayed.     Microbiology Results  Results for orders placed or performed during the hospital encounter of 11/11/17  Culture, blood (routine x 2)      Status: None   Collection Time: 11/11/17  9:42 PM  Result Value Ref Range Status   Specimen Description BLOOD LEFT HAND  Final   Special Requests  Final    BOTTLES DRAWN AEROBIC AND ANAEROBIC Blood Culture adequate volume   Culture   Final    NO GROWTH 5 DAYS Performed at [facility], [address] [place], [place] [address]    Report Status 11/16/2017 FINAL  Final  Culture, blood (routine x 2)     Status: None   Collection Time: 11/11/17 10:20 PM  Result Value Ref Range Status   Specimen Description BLOOD LEFT ANTECUBITAL  Final   Special Requests   Final    BOTTLES DRAWN AEROBIC AND ANAEROBIC Blood Culture adequate volume   Culture   Final    NO GROWTH 5 DAYS Performed at [facility], [address] [place], [place] [address]    Report Status 11/16/2017 FINAL  Final  Urine Culture     Status: Abnormal   Collection Time: 11/11/17 10:47 PM  Result Value Ref Range Status   Specimen Description   Final    URINE, RANDOM Performed at [facility], [address], [place], [place] [address]    Special Requests   Final    NONE Performed at [facility], [address], [place], [place] [address]    Culture (A)  Final    <10,000 COLONIES/mL INSIGNIFICANT GROWTH Performed at [facility], [place] [address] [place], [place] [address]    Report Status 11/13/2017 FINAL  Final    RADIOLOGY:  Ct Chest W Contrast  Result Date: 06/13/2018 CLINICAL DATA:  Follow-up possible of breast abscess as seen on prior chest CT dated 11/12/2017. History of breast cancer. Admitted for encephalopathy. EXAM: CT CHEST WITH CONTRAST TECHNIQUE: Multidetector CT imaging of the chest was performed during intravenous contrast administration. CONTRAST:  11mL OMNIPAQUE IOHEXOL 300 MG/ML  SOLN COMPARISON:  11/12/2017 FINDINGS: Cardiovascular: Normal heart size without significant pericardial effusion or thickening. No large central pulmonary embolus though study  was not tailored toward assessment of pulmonary emboli. No aneurysm of the thoracic aorta or dissection. No significant coronary arterial calcifications. Mediastinum/Nodes: No enlarged mediastinal, hilar, or axillary lymph nodes. Thyroid gland, trachea, and esophagus demonstrate no significant findings. Lungs/Pleura: No dominant or suspicious mass like abnormality of the lungs. No acute pulmonary consolidation, effusion or pneumothorax. Upper Abdomen: No acute abnormality. Fatty atrophy of the pancreas. No space-occupying mass of the included liver adrenal glands. Musculoskeletal: Post mastectomy change on the right with removal prior sub glandular saline implant. Evidence soft tissue abscess on accountable fluid collections. Intact left breast saline implant. Neural stimulator leads project to the T7 level. No aggressive osseous lesions identified IMPRESSION: 1. No soft tissue abscess is identified. 2. Interval removal of previously noted right breast implant. 3. No findings of metastasis within the chest. Electronically Signed   By: Ashley Royalty M.D.   On: 06/13/2018 20:17     Management plans discussed with the patient, family and they are in agreement.  CODE STATUS: Full Code   TOTAL TIME TAKING CARE OF THIS PATIENT: 36 minutes.    Ulis Kaps M.D on 06/14/2018 at 1:28 PM  Between 7am to 6pm - Pager - [phone]  After 6pm go to [email or web address] - Proofreader  [facility]  Office  [phone]  CC: Primary care physician; Charlann Boxer, MD   Note: This dictation was prepared with Dragon dictation along with smaller phrase technology. Any transcriptional errors that result from this process are unintentional.

## 2018-06-14 NOTE — Discharge Planning (Signed)
Patient IV and tele removed.  RN assessment and VS revealed stability for DC to home.  Discharge papers given, explained and educated.  Informed of suggested FU appt and PCP has requested that patient contact to set up follow up - patient agreed.  Given printed script.  Pain under control at discharge.  Once ride arrives, will be wheeled to front and family transporting home via car.

## 2018-06-14 NOTE — Progress Notes (Addendum)
Subjective: Patient awake, alert and oriented x4 this morning.  She was noted to be talking on the phone with family members and walking around the room without any difficulty.  She reports that she is feeling well and would like to go home.   Objective: Current vital signs: BP 133/78 (BP Location: Left Arm)   Pulse (!) 49   Temp 97.9 F (36.6 C) (Oral)   Resp 20   Ht 5\' 7"  (1.702 m)   Wt 90 kg   SpO2 99%   BMI 31.08 kg/m  Vital signs in last 24 hours: Temp:  [97.9 F (36.6 C)-98.3 F (36.8 C)] 97.9 F (36.6 C) (03/13 0730) Pulse Rate:  [49-63] 49 (03/13 0730) Resp:  [18-20] 20 (03/13 0730) BP: (118-133)/(59-78) 133/78 (03/13 0730) SpO2:  [97 %-99 %] 99 % (03/13 0730)  Intake/Output from previous day: No intake/output data recorded. Intake/Output this shift: No intake/output data recorded. Nutritional status:  Diet Order            Diet regular Room service appropriate? Yes; Fluid consistency: Thin  Diet effective now             Neurological Exam  Mental Status: Alert and oriented with some difficulty recalling the details of the past few days. Speech fluent without evidence of aphasia. Able to follow 3 step commands without difficulty.  Cranial Nerves: II: Discs flat bilaterally; Visual fields grossly normal, pupils equal, round, reactive to light and accommodation III,IV, VI: ptosis not present, extra-ocular motions intact bilaterally V,VII: smile symmetric, facial light touch sensationintact VIII: hearing normal bilaterally IX,X: gag reflex present XI: bilateral shoulder shrug XII: midline tongue extension Motor: Right :Upper extremity 5/5Without pronator driftLeft: Upper extremity 5/5 without pronator drift Right:Lower extremity 5/5Left: Lower extremity 5/5 Tone and bulk:normal tone throughout; no atrophy noted Sensory: Pinprick and light touchintact bilaterally   Lab Results: Basic  Metabolic Panel: Recent Labs  Lab 06/12/18 1527 06/13/18 0609 06/14/18 0222 06/14/18 0737  NA 140 140 140  --   K 3.0* 3.1* 2.8* 2.8*  CL 104 105 108  --   CO2 23 22 23   --   GLUCOSE 147* 142* 102*  --   BUN 16 18 13   --   CREATININE 0.64 0.64 0.66  --   CALCIUM 9.9 9.3 8.8*  --   MG  --   --  2.0  --     Liver Function Tests: Recent Labs  Lab 06/12/18 1527  AST 48*  ALT 41  ALKPHOS 82  BILITOT 1.4*  PROT 8.2*  ALBUMIN 5.0   No results for input(s): LIPASE, AMYLASE in the last 168 hours. Recent Labs  Lab 06/13/18 1238  AMMONIA 10    CBC: Recent Labs  Lab 06/12/18 1528 06/13/18 0609 06/14/18 0222  WBC 16.4* 13.8* 9.8  NEUTROABS 13.7*  --   --   HGB 15.4* 15.0 14.5  HCT 41.8 41.1 40.2  MCV 83.9 85.6 86.6  PLT 289 285 271    Cardiac Enzymes: No results for input(s): CKTOTAL, CKMB, CKMBINDEX, TROPONINI in the last 168 hours.  Lipid Panel: No results for input(s): CHOL, TRIG, HDL, CHOLHDL, VLDL, LDLCALC in the last 168 hours.  CBG: Recent Labs  Lab 06/13/18 0333  GLUCAP 119*    Microbiology: Results for orders placed or performed during the hospital encounter of 11/11/17  Culture, blood (routine x 2)     Status: None   Collection Time: 11/11/17  9:42 PM  Result Value Ref Range Status  Specimen Description BLOOD LEFT HAND  Final   Special Requests   Final    BOTTLES DRAWN AEROBIC AND ANAEROBIC Blood Culture adequate volume   Culture   Final    NO GROWTH 5 DAYS Performed at [facility], [address] [place], [place] [address]    Report Status 11/16/2017 FINAL  Final  Culture, blood (routine x 2)     Status: None   Collection Time: 11/11/17 10:20 PM  Result Value Ref Range Status   Specimen Description BLOOD LEFT ANTECUBITAL  Final   Special Requests   Final    BOTTLES DRAWN AEROBIC AND ANAEROBIC Blood Culture adequate volume   Culture   Final    NO GROWTH 5 DAYS Performed at [facility], [address]  [place], [place] [address]    Report Status 11/16/2017 FINAL  Final  Urine Culture     Status: Abnormal   Collection Time: 11/11/17 10:47 PM  Result Value Ref Range Status   Specimen Description   Final    URINE, RANDOM Performed at [facility], [address] [place], [place] [address]    Special Requests   Final    NONE Performed at [facility], [address] [place], [place] [address]    Culture (A)  Final    <10,000 COLONIES/mL INSIGNIFICANT GROWTH Performed at [facility], [place] [address] [place], [place] [address]    Report Status 11/13/2017 FINAL  Final    Coagulation Studies: No results for input(s): LABPROT, INR in the last 72 hours.  Imaging: Ct Head Wo Contrast  Result Date: 06/12/2018 CLINICAL DATA:  Encephalopathy.  Abnormal movements. EXAM: CT HEAD WITHOUT CONTRAST TECHNIQUE: Contiguous axial images were obtained from the base of the skull through the vertex without intravenous contrast. COMPARISON:  None. FINDINGS: Brain: The study is degraded by patient motion. Multiple attempts were made. There is less motion on the second attempt. No acute infarct, hemorrhage, or mass lesion is present. The ventricles are of normal size. No significant white matter lesions are present. No significant extraaxial fluid collection is present. Vascular: No hyperdense vessel or unexpected calcification. Skull: Calvarium is intact. No focal lytic or blastic lesions are present. Sinuses/Orbits: The paranasal sinuses and mastoid air cells are clear. The globes and orbits are within normal limits. IMPRESSION: Normal CT of the head. Electronically Signed   By: San Morelle M.D.   On: 06/12/2018 16:47   Ct Chest W Contrast  Result Date: 06/13/2018 CLINICAL DATA:  Follow-up possible of breast abscess as seen on prior chest CT dated 11/12/2017. History of breast cancer. Admitted for encephalopathy. EXAM: CT CHEST WITH CONTRAST TECHNIQUE: Multidetector CT imaging  of the chest was performed during intravenous contrast administration. CONTRAST:  55mL OMNIPAQUE IOHEXOL 300 MG/ML  SOLN COMPARISON:  11/12/2017 FINDINGS: Cardiovascular: Normal heart size without significant pericardial effusion or thickening. No large central pulmonary embolus though study was not tailored toward assessment of pulmonary emboli. No aneurysm of the thoracic aorta or dissection. No significant coronary arterial calcifications. Mediastinum/Nodes: No enlarged mediastinal, hilar, or axillary lymph nodes. Thyroid gland, trachea, and esophagus demonstrate no significant findings. Lungs/Pleura: No dominant or suspicious mass like abnormality of the lungs. No acute pulmonary consolidation, effusion or pneumothorax. Upper Abdomen: No acute abnormality. Fatty atrophy of the pancreas. No space-occupying mass of the included liver adrenal glands. Musculoskeletal: Post mastectomy change on the right with removal prior sub glandular saline implant. Evidence soft tissue abscess on accountable fluid collections.  Intact left breast saline implant. Neural stimulator leads project to the T7 level. No aggressive osseous lesions identified IMPRESSION: 1. No soft tissue abscess is identified. 2. Interval removal of previously noted right breast implant. 3. No findings of metastasis within the chest. Electronically Signed   By: Ashley Royalty M.D.   On: 06/13/2018 20:17    Medications:  I have reviewed the patient's current medications. Prior to Admission:  Medications Prior to Admission  Medication Sig Dispense Refill Last Dose  . ADVAIR DISKUS 500-50 MCG/DOSE AEPB Inhale 1 puff into the lungs 2 (two) times daily.  6 unknown at unknown  . albuterol (PROVENTIL HFA;VENTOLIN HFA) 108 (90 Base) MCG/ACT inhaler Inhale 2 puffs into the lungs every 6 (six) hours as needed for wheezing or shortness of breath.   prn at prn  . baclofen (LIORESAL) 20 MG tablet Take 20 mg by mouth every 8 (eight) hours as needed for muscle  spasms.    prn at prn  . DULoxetine (CYMBALTA) 60 MG capsule Take 60 mg by mouth at bedtime.   unknown at unknown  . escitalopram (LEXAPRO) 20 MG tablet Take 20 mg by mouth daily.   unknown at unknown  . montelukast (SINGULAIR) 10 MG tablet Take 10 mg by mouth daily.   unknown at unknown  . morphine (MS CONTIN) 60 MG 12 hr tablet Take 60 mg by mouth every 8 (eight) hours.   unknown at unknown  . morphine (MSIR) 30 MG tablet Take 30 mg by mouth 2 (two) times daily as needed for severe pain.   unknown at unknown  . MOVANTIK 25 MG TABS tablet Take 25 mg by mouth daily.  1 unknown at unknown  . pregabalin (LYRICA) 200 MG capsule Take 200 mg by mouth 3 (three) times daily.   unknown at unknown  . tiotropium (SPIRIVA) 18 MCG inhalation capsule Place 18 mcg into inhaler and inhale daily.   unknown at unknown   Scheduled: . divalproex  500 mg Oral Q12H  . docusate sodium  100 mg Oral BID  . DULoxetine  60 mg Oral QHS  . enoxaparin (LOVENOX) injection  40 mg Subcutaneous Q24H  . escitalopram  20 mg Oral Daily  . mometasone-formoterol  2 puff Inhalation BID  . montelukast  10 mg Oral Daily  . morphine  60 mg Oral Q8H  . naloxegol oxalate  25 mg Oral Daily  . potassium chloride  40 mEq Oral Once  . potassium chloride  40 mEq Oral Once  . pregabalin  200 mg Oral TID  . tiotropium  18 mcg Inhalation Daily   Patient seen and examined.  Clinical course and management discussed.  Necessary edits performed.  I agree with the above.  Assessment and plan of care developed and discussed below.    Assessment: 58 year old female with past medical history of anxiety, depression, breast cancer, compartment syndrome of left lower extremity, DVT, sciatic nerve pain, fibromyalgia, PTSD, and obstructive sleep apnea presenting to the ED on 06/12/2018 with altered mental status. She is recently admitted twice for similar episode which was thought to be related to her medication.  Work-up at that time revealed normal CT  head, however CT chest showed extensive fluid around the right breast implant with edema in the adjacent soft tissue concerning for an abscess around the implant, there was also abnormal irregular soft tissue density in the right axilla and right lateral chest wall, cellulitis and tumor could not be excluded.  Unclear if this  was the source of her elevated white count of 37.4 at that time.  WBC improved now 9.8 this morning. A repeat CT chest shows tissue abscess or finding of metastasis within the chest.  Unable to perform MRI of the brain due to neurostimulator placement.  EEG abnormal electroencephalogram secondary to bifrontal spike and slow wave activity at a frequency of 2-3 Hz occurring in bursts lasting up to five second. Loaded with Depakote 500 mg IV once with maintenance dose of 500 mg BID.  Patient mental status improved, she is more alert today and oriented x4.  Concern is that patient may have been having some seizure activity that interfered with her cognitive function and may have led to some incorrect medication administration.  Unclear etiology for seizure activity.  Head CT unremarkable.  Patient will require a MRI of the brain (has a history of cancer).  Although case discussed with radiologist (Dr. Dorann Lodge) who reported MRI could be done in 1.5T machine, radiology techs have refused to perform the procedure.    Recommendation 1. Continue Depakote 500 po BID.  Marland Kitchen   2. Patient unable to drive, operate heavy machinery, perform activities at heights and participate in water activities until release by outpatient physician. 3. MRI to be performed on an outpatient basis 4. No further neurologic intervention is recommended at this time.  If further questions arise, please call or page at that time.  Thank you for allowing neurology to participate in the care of this patient.  Patient to follow up with neurology on an outpatient basis   This patient was staffed with Dr. Magda Paganini, Doy Mince who  personally evaluated patient, reviewed documentation and agreed with assessment and plan of care as above.  Rufina Falco, DNP, FNP-BC Board certified Nurse Practitioner Neurology Department    LOS: 2 days   06/14/2018  10:18 AM  Alexis Goodell, MD Neurology [phone]  06/14/2018  10:43 AM

## 2018-06-15 LAB — VITAMIN B1: Vitamin B1 (Thiamine): 183.9 nmol/L (ref 66.5–200.0)

## 2018-06-17 LAB — LYME DISEASE, WESTERN BLOT
IGG P18 AB.: ABSENT
IGG P28 AB.: ABSENT
IgG P23 Ab.: ABSENT
IgG P30 Ab.: ABSENT
IgG P39 Ab.: ABSENT
IgG P45 Ab.: ABSENT
IgG P58 Ab.: ABSENT
IgG P66 Ab.: ABSENT
IgG P93 Ab.: ABSENT
IgM P23 Ab.: ABSENT
IgM P39 Ab.: ABSENT
IgM P41 Ab.: ABSENT
LYME IGG WB: NEGATIVE
Lyme IgM Wb: NEGATIVE

## 2018-08-22 ENCOUNTER — Other Ambulatory Visit: Payer: Self-pay

## 2018-08-22 ENCOUNTER — Encounter: Payer: Self-pay | Admitting: *Deleted

## 2018-08-22 ENCOUNTER — Inpatient Hospital Stay
Admission: EM | Admit: 2018-08-22 | Discharge: 2018-08-23 | DRG: 100 | Disposition: A | Discharge status: Home / Self Care | Payer: Medicare Other | Attending: Internal Medicine | Admitting: Internal Medicine

## 2018-08-22 ENCOUNTER — Emergency Department: Payer: Medicare Other

## 2018-08-22 DIAGNOSIS — Z9010 Acquired absence of unspecified breast and nipple: Secondary | ICD-10-CM

## 2018-08-22 DIAGNOSIS — Z79899 Other long term (current) drug therapy: Secondary | ICD-10-CM

## 2018-08-22 DIAGNOSIS — G92 Toxic encephalopathy: Secondary | ICD-10-CM | POA: Diagnosis present

## 2018-08-22 DIAGNOSIS — F419 Anxiety disorder, unspecified: Secondary | ICD-10-CM | POA: Diagnosis present

## 2018-08-22 DIAGNOSIS — J45909 Unspecified asthma, uncomplicated: Secondary | ICD-10-CM | POA: Diagnosis present

## 2018-08-22 DIAGNOSIS — Z20828 Contact with and (suspected) exposure to other viral communicable diseases: Secondary | ICD-10-CM | POA: Diagnosis present

## 2018-08-22 DIAGNOSIS — Z9682 Presence of neurostimulator: Secondary | ICD-10-CM | POA: Diagnosis not present

## 2018-08-22 DIAGNOSIS — Z853 Personal history of malignant neoplasm of breast: Secondary | ICD-10-CM | POA: Diagnosis not present

## 2018-08-22 DIAGNOSIS — F329 Major depressive disorder, single episode, unspecified: Secondary | ICD-10-CM | POA: Diagnosis present

## 2018-08-22 DIAGNOSIS — Z880 Allergy status to penicillin: Secondary | ICD-10-CM

## 2018-08-22 DIAGNOSIS — Z825 Family history of asthma and other chronic lower respiratory diseases: Secondary | ICD-10-CM | POA: Diagnosis not present

## 2018-08-22 DIAGNOSIS — Z79891 Long term (current) use of opiate analgesic: Secondary | ICD-10-CM | POA: Diagnosis not present

## 2018-08-22 DIAGNOSIS — G40909 Epilepsy, unspecified, not intractable, without status epilepticus: Principal | ICD-10-CM | POA: Diagnosis present

## 2018-08-22 DIAGNOSIS — Z91048 Other nonmedicinal substance allergy status: Secondary | ICD-10-CM

## 2018-08-22 DIAGNOSIS — Z8249 Family history of ischemic heart disease and other diseases of the circulatory system: Secondary | ICD-10-CM

## 2018-08-22 DIAGNOSIS — Z87891 Personal history of nicotine dependence: Secondary | ICD-10-CM | POA: Diagnosis not present

## 2018-08-22 DIAGNOSIS — Z7951 Long term (current) use of inhaled steroids: Secondary | ICD-10-CM | POA: Diagnosis not present

## 2018-08-22 DIAGNOSIS — Z981 Arthrodesis status: Secondary | ICD-10-CM | POA: Diagnosis not present

## 2018-08-22 DIAGNOSIS — R569 Unspecified convulsions: Secondary | ICD-10-CM

## 2018-08-22 DIAGNOSIS — R4182 Altered mental status, unspecified: Secondary | ICD-10-CM

## 2018-08-22 DIAGNOSIS — T40605A Adverse effect of unspecified narcotics, initial encounter: Secondary | ICD-10-CM | POA: Diagnosis present

## 2018-08-22 DIAGNOSIS — G9341 Metabolic encephalopathy: Secondary | ICD-10-CM | POA: Diagnosis present

## 2018-08-22 HISTORY — DX: Unspecified convulsions: R56.9

## 2018-08-22 LAB — CBC
HCT: 41.4 % (ref 36.0–46.0)
Hemoglobin: 14.3 g/dL (ref 12.0–15.0)
MCH: 31.1 pg (ref 26.0–34.0)
MCHC: 34.5 g/dL (ref 30.0–36.0)
MCV: 90 fL (ref 80.0–100.0)
Platelets: 188 10*3/uL (ref 150–400)
RBC: 4.6 MIL/uL (ref 3.87–5.11)
RDW: 11.9 % (ref 11.5–15.5)
WBC: 4 10*3/uL (ref 4.0–10.5)
nRBC: 0 % (ref 0.0–0.2)

## 2018-08-22 LAB — HEPATIC FUNCTION PANEL
ALT: 12 U/L (ref 0–44)
AST: 15 U/L (ref 15–41)
Albumin: 3.5 g/dL (ref 3.5–5.0)
Alkaline Phosphatase: 62 U/L (ref 38–126)
Bilirubin, Direct: <0.1 mg/dL (ref 0.0–0.2)
Total Bilirubin: 0.5 mg/dL (ref 0.3–1.2)
Total Protein: 6.3 g/dL — ABNORMAL LOW (ref 6.5–8.1)

## 2018-08-22 LAB — BLOOD GAS, VENOUS
Acid-Base Excess: 7.4 mmol/L — ABNORMAL HIGH (ref 0.0–2.0)
Bicarbonate: 34.7 mmol/L — ABNORMAL HIGH (ref 20.0–28.0)
O2 Saturation: 75.8 %
Patient temperature: 37
pCO2, Ven: 60 mmHg (ref 44.0–60.0)
pH, Ven: 7.37 (ref 7.250–7.430)
pO2, Ven: 42 mmHg (ref 32.0–45.0)

## 2018-08-22 LAB — VALPROIC ACID LEVEL: Valproic Acid Lvl: 90 ug/mL (ref 50.0–100.0)

## 2018-08-22 LAB — BASIC METABOLIC PANEL
Anion gap: 11 (ref 5–15)
BUN: 10 mg/dL (ref 6–20)
CO2: 30 mmol/L (ref 22–32)
Calcium: 8.9 mg/dL (ref 8.9–10.3)
Chloride: 99 mmol/L (ref 98–111)
Creatinine, Ser: 0.6 mg/dL (ref 0.44–1.00)
GFR calc Af Amer: >60 mL/min (ref >60)
GFR calc non Af Amer: >60 mL/min (ref >60)
Glucose, Bld: 112 mg/dL — ABNORMAL HIGH (ref 70–99)
Potassium: 3.9 mmol/L (ref 3.5–5.1)
Sodium: 140 mmol/L (ref 135–145)

## 2018-08-22 LAB — SALICYLATE LEVEL: Salicylate Lvl: <7 mg/dL (ref 2.8–30.0)

## 2018-08-22 LAB — URINALYSIS, COMPLETE (UACMP) WITH MICROSCOPIC
Bacteria, UA: NONE SEEN
Bilirubin Urine: NEGATIVE
Glucose, UA: NEGATIVE mg/dL
Hgb urine dipstick: NEGATIVE
Ketones, ur: NEGATIVE mg/dL
Leukocytes,Ua: NEGATIVE
Nitrite: NEGATIVE
Protein, ur: NEGATIVE mg/dL
Specific Gravity, Urine: 1.01 (ref 1.005–1.030)
Squamous Epithelial / HPF: NONE SEEN (ref 0–5)
pH: 7 (ref 5.0–8.0)

## 2018-08-22 LAB — AMMONIA: Ammonia: 35 umol/L (ref 9–35)

## 2018-08-22 LAB — URINE DRUG SCREEN, QUALITATIVE (ARMC ONLY)
Amphetamines, Ur Screen: NOT DETECTED
Barbiturates, Ur Screen: NOT DETECTED
Benzodiazepine, Ur Scrn: NOT DETECTED
Cannabinoid 50 Ng, Ur ~~LOC~~: NOT DETECTED
Cocaine Metabolite,Ur ~~LOC~~: NOT DETECTED
MDMA (Ecstasy)Ur Screen: NOT DETECTED
Methadone Scn, Ur: NOT DETECTED
Opiate, Ur Screen: POSITIVE — AB
Phencyclidine (PCP) Ur S: NOT DETECTED
Tricyclic, Ur Screen: NOT DETECTED

## 2018-08-22 LAB — ACETAMINOPHEN LEVEL: Acetaminophen (Tylenol), Serum: <10 ug/mL — ABNORMAL LOW (ref 10–30)

## 2018-08-22 LAB — ETHANOL: Alcohol, Ethyl (B): <10 mg/dL (ref <10)

## 2018-08-22 LAB — SARS CORONAVIRUS 2 BY RT PCR (HOSPITAL ORDER, PERFORMED IN ~~LOC~~ HOSPITAL LAB): SARS Coronavirus 2: NEGATIVE

## 2018-08-22 MED ORDER — MOMETASONE FURO-FORMOTEROL FUM 200-5 MCG/ACT IN AERO
2.0000 | INHALATION_SPRAY | Freq: Two times a day (BID) | RESPIRATORY_TRACT | Status: DC
  Administered 2018-08-22: 2 via RESPIRATORY_TRACT
  Filled 2018-08-22: qty 8.8

## 2018-08-22 MED ORDER — SODIUM CHLORIDE 0.9% FLUSH
3.0000 mL | Freq: Once | INTRAVENOUS | Status: AC
  Administered 2018-08-22: 3 mL via INTRAVENOUS

## 2018-08-22 MED ORDER — ENOXAPARIN SODIUM 40 MG/0.4ML ~~LOC~~ SOLN
40.0000 mg | SUBCUTANEOUS | Status: DC
  Administered 2018-08-22: 40 mg via SUBCUTANEOUS
  Filled 2018-08-22: qty 0.4

## 2018-08-22 MED ORDER — TIOTROPIUM BROMIDE MONOHYDRATE 18 MCG IN CAPS
18.0000 ug | ORAL_CAPSULE | Freq: Every day | RESPIRATORY_TRACT | Status: DC
  Administered 2018-08-22: 18 ug via RESPIRATORY_TRACT
  Filled 2018-08-22: qty 5

## 2018-08-22 MED ORDER — SODIUM CHLORIDE 0.9 % IV BOLUS
1000.0000 mL | Freq: Once | INTRAVENOUS | Status: AC
  Administered 2018-08-22: 1000 mL via INTRAVENOUS

## 2018-08-22 MED ORDER — ALBUTEROL SULFATE (2.5 MG/3ML) 0.083% IN NEBU: 2.5000 mg | INHALATION_SOLUTION | Freq: Four times a day (QID) | RESPIRATORY_TRACT | Status: DC | PRN

## 2018-08-22 MED ORDER — DEXTROSE-NACL 5-0.9 % IV SOLN
INTRAVENOUS | Status: DC
  Administered 2018-08-22 – 2018-08-23 (×2): via INTRAVENOUS

## 2018-08-22 NOTE — ED Notes (Signed)
ED TO INPATIENT HANDOFF REPORT  ED Nurse Name and Phone #: Antonieta Pert, RN [phone]  S Name/Age/Gender Tona Sensing 58 y.o. female Room/Bed: ED26A/ED26A  Code Status   Code Status: Full Code  Home/SNF/Other Home Patient oriented to: self, place, time and situation Is this baseline? Yes   Triage Complete: Triage complete  Chief Complaint syncopal episode  Triage Note Pt to ED from home after family reported a syncopal episode where pt went unresponsive and started shaking. Pt has a hx of seizures. EMS reports pt pt was not responding well to them but responded to ammonia and a sternal rub. Pt was returning from [facility] after having a sleep study performed when episode occured.   EMS vitals:  97% RA 64 NSR CBG 111 106/61 repeat 100/48    Allergies Allergies  Allergen Reactions  . Other Hives    Significant skin reaction to Vicryl suture  . Penicillins Anaphylaxis and Other (See Comments)    **Patient received ceftriaxone 1g IV in ED on 07/08/17 w/o ADR Has patient had a PCN reaction causing immediate rash, facial/tongue/throat swelling, SOB or lightheadedness with hypotension: Yes Has patient had a PCN reaction causing severe rash involving mucus membranes or skin necrosis: Unknown Has patient had a PCN reaction that required hospitalization: Yes Has patient had a PCN reaction occurring within the last 10 years: No If all above answers are "NO", then may proceed with Cephalosporin use.  . Tape Rash    Level of Care/Admitting Diagnosis ED Disposition    ED Disposition Condition [facility]: [place] [100120]  Level of Care: Telemetry [5]  Covid Evaluation: Screening Protocol (No Symptoms)  Diagnosis: Acute metabolic encephalopathy [[id number]]  Admitting Physician: Otila Back [place]  Attending Physician: Otila Back [[id number]]  Estimated length of stay: past midnight tomorrow  Certification:: I certify this patient will need  inpatient services for at least 2 midnights  PT Class (Do Not Modify): Inpatient [101]  PT Acc Code (Do Not Modify): Private [1]       B Medical/Surgery History Past Medical History:  Diagnosis Date  . Anemia   . Anxiety   . Asthma   . Breast cancer ([place])   . 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 Depression   . DVT (deep venous thrombosis) ([place])   . Sciatic nerve injury   . Seizures ([place])    Past Surgical History:  Procedure Laterality Date  . CERVICAL FUSION    . FASCIOTOMY Left   . LUMBAR FUSION    . MASTECTOMY    . SPINAL CORD STIMULATOR IMPLANT    . TONSILLECTOMY       A IV Location/Drains/Wounds Patient Lines/Drains/Airways Status   Active Line/Drains/Airways    Name:   Placement date:   Placement time:   Site:   Days:   Peripheral IV 08/22/18 Left Antecubital   08/22/18    1137    Antecubital   less than 1          Intake/Output Last 24 hours  Intake/Output Summary (Last 24 hours) at 08/22/2018 1857 Last data filed at 08/22/2018 1544 Gross per 24 hour  Intake 1000 ml  Output -  Net 1000 ml    Labs/Imaging Results for orders placed or performed during the hospital encounter of 08/22/18 (from the past 48 hour(s))  Basic metabolic panel     Status: Abnormal   Collection Time: 08/22/18 11:40 AM  Result Value Ref Range  Sodium 140 135 - 145 mmol/L   Potassium 3.9 3.5 - 5.1 mmol/L   Chloride 99 98 - 111 mmol/L   CO2 30 22 - 32 mmol/L   Glucose, Bld 112 (H) 70 - 99 mg/dL   BUN 10 6 - 20 mg/dL   Creatinine, Ser 0.60 0.44 - 1.00 mg/dL   Calcium 8.9 8.9 - 10.3 mg/dL   GFR calc non Af Amer >60 >60 mL/min   GFR calc Af Amer >60 >60 mL/min   Anion gap 11 5 - 15    Comment: Performed at [facility], [address] [place], [place] [address]  CBC     Status: None   Collection Time: 08/22/18 11:40 AM  Result Value Ref Range   WBC 4.0 4.0 - 10.5 K/uL   RBC 4.60 3.87 - 5.11 MIL/uL   Hemoglobin 14.3 12.0 - 15.0 g/dL   HCT 41.4  36.0 - 46.0 %   MCV 90.0 80.0 - 100.0 fL   MCH 31.1 26.0 - 34.0 pg   MCHC 34.5 30.0 - 36.0 g/dL   RDW 11.9 11.5 - 15.5 %   Platelets 188 150 - 400 K/uL   nRBC 0.0 0.0 - 0.2 %    Comment: Performed at [facility], [address] [place], [place] [address]  Urinalysis, Complete w Microscopic     Status: Abnormal   Collection Time: 08/22/18 11:40 AM  Result Value Ref Range   Color, Urine YELLOW (A) YELLOW   APPearance CLEAR (A) CLEAR   Specific Gravity, Urine 1.010 1.005 - 1.030   pH 7.0 5.0 - 8.0   Glucose, UA NEGATIVE NEGATIVE mg/dL   Hgb urine dipstick NEGATIVE NEGATIVE   Bilirubin Urine NEGATIVE NEGATIVE   Ketones, ur NEGATIVE NEGATIVE mg/dL   Protein, ur NEGATIVE NEGATIVE mg/dL   Nitrite NEGATIVE NEGATIVE   Leukocytes,Ua NEGATIVE NEGATIVE   WBC, UA 0-5 0 - 5 WBC/hpf   Bacteria, UA NONE SEEN NONE SEEN   Squamous Epithelial / LPF NONE SEEN 0 - 5   Mucus PRESENT     Comment: Performed at [facility], [address] [place], [place] [address]  Blood gas, venous     Status: Abnormal   Collection Time: 08/22/18 12:01 PM  Result Value Ref Range   pH, Ven 7.37 7.250 - 7.430   pCO2, Ven 60 44.0 - 60.0 mmHg   pO2, Ven 42.0 32.0 - 45.0 mmHg   Bicarbonate 34.7 (H) 20.0 - 28.0 mmol/L   Acid-Base Excess 7.4 (H) 0.0 - 2.0 mmol/L   O2 Saturation 75.8 %   Patient temperature 37.0    Collection site VEIN    Sample type VEIN     Comment: Performed at [facility], [address] [place], [place] [address]  Ammonia     Status: None   Collection Time: 08/22/18 12:01 PM  Result Value Ref Range   Ammonia 35 9 - 35 umol/L    Comment: Performed at [facility], [address] [place], [place] [address]  Hepatic function panel     Status: Abnormal   Collection Time: 08/22/18 12:01 PM  Result Value Ref Range   Total Protein 6.3 (L) 6.5 - 8.1 g/dL   Albumin 3.5 3.5 - 5.0 g/dL   AST 15 15 - 41 U/L   ALT 12 0 - 44 U/L   Alkaline Phosphatase 62 38  - 126 U/L   Total Bilirubin 0.5 0.3 - 1.2 mg/dL   Bilirubin, Direct <  0.1 0.0 - 0.2 mg/dL   Indirect Bilirubin NOT CALCULATED 0.3 - 0.9 mg/dL    Comment: Performed at [facility], [address] [place], [place] [address]  Acetaminophen level     Status: Abnormal   Collection Time: 08/22/18 12:01 PM  Result Value Ref Range   Acetaminophen (Tylenol), Serum <10 (L) 10 - 30 ug/mL    Comment: (NOTE) Therapeutic concentrations vary significantly. A range of 10-30 ug/mL  may be an effective concentration for many patients. However, some  are best treated at concentrations outside of this range. Acetaminophen concentrations >150 ug/mL at 4 hours after ingestion  and >50 ug/mL at 12 hours after ingestion are often associated with  toxic reactions. Performed at [facility], [address] [place], [place] [address]   Salicylate level     Status: None   Collection Time: 08/22/18 12:01 PM  Result Value Ref Range   Salicylate Lvl <0.7 2.8 - 30.0 mg/dL    Comment: Performed at [facility], [address] [place], [place] [address]  Ethanol     Status: None   Collection Time: 08/22/18 12:01 PM  Result Value Ref Range   Alcohol, Ethyl (B) <10 <10 mg/dL    Comment: (NOTE) Lowest detectable limit for serum alcohol is 10 mg/dL. For medical purposes only. Performed at [facility], [address] [place], [place] [address]   Urine Drug Screen, Qualitative [facility] only)     Status: Abnormal   Collection Time: 08/22/18 12:01 PM  Result Value Ref Range   Tricyclic, Ur Screen NONE DETECTED NONE DETECTED   Amphetamines, Ur Screen NONE DETECTED NONE DETECTED   MDMA (Ecstasy)Ur Screen NONE DETECTED NONE DETECTED   Cocaine Metabolite,Ur [place] NONE DETECTED NONE DETECTED   Opiate, Ur Screen POSITIVE (A) NONE DETECTED   Phencyclidine (PCP) Ur S NONE DETECTED NONE DETECTED   Cannabinoid 50 Ng, Ur [place] NONE DETECTED NONE DETECTED   Barbiturates, Ur Screen NONE  DETECTED NONE DETECTED   Benzodiazepine, Ur Scrn NONE DETECTED NONE DETECTED   Methadone Scn, Ur NONE DETECTED NONE DETECTED    Comment: (NOTE) Tricyclics + metabolites, urine    Cutoff 1000 ng/mL Amphetamines + metabolites, urine  Cutoff 1000 ng/mL MDMA (Ecstasy), urine              Cutoff 500 ng/mL Cocaine Metabolite, urine          Cutoff 300 ng/mL Opiate + metabolites, urine        Cutoff 300 ng/mL Phencyclidine (PCP), urine         Cutoff 25 ng/mL Cannabinoid, urine                 Cutoff 50 ng/mL Barbiturates + metabolites, urine  Cutoff 200 ng/mL Benzodiazepine, urine              Cutoff 200 ng/mL Methadone, urine                   Cutoff 300 ng/mL The urine drug screen provides only a preliminary, unconfirmed analytical test result and should not be used for non-medical purposes. Clinical consideration and professional judgment should be applied to any positive drug screen result due to possible interfering substances. A more specific alternate chemical method must be used in order to obtain a confirmed analytical result. Gas chromatography / mass spectrometry (GC/MS) is the preferred confirmat ory method. Performed at [facility], [address], [place],  [address]   SARS Coronavirus 2 (  CEPHEID - Performed in [place] hospital lab), Hosp Order     Status: None   Collection Time: 08/22/18  2:08 PM  Result Value Ref Range   SARS Coronavirus 2 NEGATIVE NEGATIVE    Comment: (NOTE) If result is NEGATIVE SARS-CoV-2 target nucleic acids are NOT DETECTED. The SARS-CoV-2 RNA is generally detectable in upper and lower  respiratory specimens during the acute phase of infection. The lowest  concentration of SARS-CoV-2 viral copies this assay can detect is 250  copies / mL. A negative result does not preclude SARS-CoV-2 infection  and should not be used as the sole basis for treatment or other  patient management decisions.  A negative result may occur with   improper specimen collection / handling, submission of specimen other  than nasopharyngeal swab, presence of viral mutation(s) within the  areas targeted by this assay, and inadequate number of viral copies  (<250 copies / mL). A negative result must be combined with clinical  observations, patient history, and epidemiological information. If result is POSITIVE SARS-CoV-2 target nucleic acids are DETECTED. The SARS-CoV-2 RNA is generally detectable in upper and lower  respiratory specimens dur ing the acute phase of infection.  Positive  results are indicative of active infection with SARS-CoV-2.  Clinical  correlation with patient history and other diagnostic information is  necessary to determine patient infection status.  Positive results do  not rule out bacterial infection or co-infection with other viruses. If result is PRESUMPTIVE POSTIVE SARS-CoV-2 nucleic acids MAY BE PRESENT.   A presumptive positive result was obtained on the submitted specimen  and confirmed on repeat testing.  While 2019 novel coronavirus  (SARS-CoV-2) nucleic acids may be present in the submitted sample  additional confirmatory testing may be necessary for epidemiological  and / or clinical management purposes  to differentiate between  SARS-CoV-2 and other Sarbecovirus currently known to infect humans.  If clinically indicated additional testing with an alternate test  methodology [phone]) is advised. The SARS-CoV-2 RNA is generally  detectable in upper and lower respiratory sp ecimens during the acute  phase of infection. The expected result is Negative. Fact Sheet for Patients:  StrictlyIdeas.no Fact Sheet for Healthcare Providers: BankingDealers.co.za This test is not yet approved or cleared by the [place] FDA and has been authorized for detection and/or diagnosis of SARS-CoV-2 by FDA under an Emergency Use Authorization (EUA).  This EUA will  remain in effect (meaning this test can be used) for the duration of the COVID-19 declaration under Section 564(b)(1) of the Act, 21 U.S.C. section 360bbb-3(b)(1), unless the authorization is terminated or revoked sooner. Performed at [facility], [place], [place] [address]    Ct Head Wo Contrast  Result Date: 08/22/2018 CLINICAL DATA:  Syncope and unresponsive. EXAM: CT HEAD WITHOUT CONTRAST TECHNIQUE: Contiguous axial images were obtained from the base of the skull through the vertex without intravenous contrast. COMPARISON:  06/12/2018 FINDINGS: Brain: There is no mass effect, midline shift, or acute intracranial hemorrhage. Brain parenchyma and ventricular system are within normal limits. Vascular: No hyperdense vessel or unexpected calcification. Skull: The cranium is intact. Sinuses/Orbits: Mastoid air cells are clear. There is layering mucus material in the sphenoid sinuses. Orbits are within normal limits. Other: Noncontributory. IMPRESSION: No acute intracranial pathology. Electronically Signed   By: Marybelle Killings M.D.   On: 08/22/2018 12:34    Pending Labs Unresulted Labs (From admission, onward)    Start     Ordered   08/23/18  6553  Basic metabolic panel  Tomorrow morning,   STAT     08/22/18 1847   08/23/18 0500  CBC  Tomorrow morning,   STAT     08/22/18 1847   08/23/18 0500  Magnesium  Tomorrow morning,   STAT     08/22/18 1847   08/22/18 1857  Valproic acid level  Once,   STAT     08/22/18 1856          Vitals/Pain Today's Vitals   08/22/18 1645 08/22/18 1700 08/22/18 1715 08/22/18 1719  BP: 126/72 103/62 95/60   Pulse: 60 (!) 56 (!) 59   Resp: 13 12 11    Temp:      TempSrc:      SpO2: 100% 100% 100%   Weight:      PainSc:    0-No pain    Isolation Precautions No active isolations  Medications Medications  mometasone-formoterol (DULERA) 200-5 MCG/ACT inhaler 2 puff (has no administration in time range)  albuterol (VENTOLIN HFA)  108 (90 Base) MCG/ACT inhaler 2 puff (has no administration in time range)  tiotropium (SPIRIVA) inhalation capsule ([facility] use ONLY) 18 mcg (has no administration in time range)  enoxaparin (LOVENOX) injection 40 mg (has no administration in time range)  dextrose 5 %-0.9 % sodium chloride infusion (has no administration in time range)  sodium chloride flush (NS) 0.9 % injection 3 mL (3 mLs Intravenous Given 08/22/18 1247)  sodium chloride 0.9 % bolus 1,000 mL (0 mLs Intravenous Stopped 08/22/18 1544)    Mobility walks High fall risk   Focused Assessments Neuro Assessment Handoff:  Swallow screen pass? No indication to perform swallow screen. Pt's responsiveness is improving. Cardiac Rhythm: Normal sinus rhythm       Neuro Assessment: Exceptions to WDL Neuro Checks:      Last Documented NIHSS Modified Score:   Has TPA been given? No If patient is a Neuro Trauma and patient is going to OR before floor call report to [place] nurse: [phone] or [phone]     R Recommendations: See Admitting Provider Note  Report given to:   Additional Notes: Pt is becoming more responsive to verbal stimuli. Pt denies taking too much of her narcotic pain meds this morning. Pt is unable to state what meds she normally takes.

## 2018-08-22 NOTE — ED Notes (Signed)
RN called and spoke to emergency contact who reports pts LKW was 9:00 this morning. PT was picked up at [facility] after a sleep study because she was told she was having seizures while she slept. Family unsure of results from sleep study (pt did not receive discharge papers per daughter). Pt was at baseline at 8:30 when picked up and then during drive home pt started shaking in a way that daughter states did not seem seizure like in nature or at least not similar to past episodes. Pt was unable to hold her breakfast and was not responding appropriately. EMS was called upon arrival to home when shaking continued and pt was not responding to family at all. Daughter reports pt has been complient with seizure medications prescribed recently after new dx of seizures at [facility].

## 2018-08-22 NOTE — ED Notes (Signed)
Patient transported to CT 

## 2018-08-22 NOTE — ED Provider Notes (Signed)
Patient is still very difficult to arouse.  Her coronavirus test is negative.  Nurse reports she has occasional episodes of apparent apnea and was de-satting into the low 90s this is been cured with 2 L of oxygen.  Patient's blood pressure still low even after liter of fluids.  Safest thing to do would be admit her and continue observing her.  She has a history of doing this in the past that has been felt to be prolonged postictal phase.   Nena Polio, MD 08/22/18 1534

## 2018-08-22 NOTE — ED Notes (Signed)
Pt not responding to RNs verbal questions at this time but reacted when stuck for IV. RN assessed pts eyes and pt fought against RN to keep eyes closed.

## 2018-08-22 NOTE — H&P (Signed)
[facility] NAME: Rebecca Colon    MR#:  [id number]  DATE OF BIRTH:  1960-08-02  DATE OF ADMISSION:  08/22/2018  PRIMARY CARE PHYSICIAN: Charlann Boxer, MD   REQUESTING/REFERRING PHYSICIAN: Alfred Levins, [place]:   Chief Complaint  Patient presents with  . Loss of Consciousness    HISTORY OF PRESENT ILLNESS:  Rebecca Colon  is a 58 y.o. female with a known history of seizures, depression and chronic opiate use on high doses of MS Contin scheduled and intermediate release morphine sulfate as needed was brought into the emergency room with complaints of altered mental status.  Patient should have had at least 3 episodes of similar presentations in the past.  Further history obtained from nursing staff in the emergency room who confirmed patient had a sleep study done at [facility] yesterday.  There is suspicion that patient may have taken extra narcotics this morning because she would not be allowed to take narcotics during the sleep study.  Patient was reported to have become more confused in route to her house.  EMS was called.  Blood sugar was 111.  No urinary or bowel incontinence to suggest patient being postictal.  Patient was evaluated in the emergency room and work-up done supine emergency room including CT scan of the head with no acute findings.  Narcan was not given given patient's chronic narcotic use to prevent acute withdrawal.  Medical service called to monitor patient until effect of narcotic wears out of the system.  Patient becoming more gradually arousable.  Hemodynamically stable.  PAST MEDICAL HISTORY:   Past Medical History:  Diagnosis Date  . Anemia   . Anxiety   . Asthma   . Breast cancer ([place])   . 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 Depression   . DVT (deep venous thrombosis) ([place])   . Sciatic nerve injury   . Seizures ([place])     PAST SURGICAL HISTORY:   Past Surgical History:   Procedure Laterality Date  . CERVICAL FUSION    . FASCIOTOMY Left   . LUMBAR FUSION    . MASTECTOMY    . SPINAL CORD STIMULATOR IMPLANT    . TONSILLECTOMY      SOCIAL HISTORY:   Social History   Tobacco Use  . Smoking status: Former Smoker    Last attempt to quit: 09/12/2012    Years since quitting: 5.9  . Smokeless tobacco: Never Used  Substance Use Topics  . Alcohol use: Yes    FAMILY HISTORY:   Family History  Problem Relation Age of Onset  . COPD Mother   . Heart disease Mother   . Hypertension Mother   . COPD Father   . Heart disease Father   . Hypertension Father     DRUG ALLERGIES:   Allergies  Allergen Reactions  . Other Hives    Significant skin reaction to Vicryl suture  . Penicillins Anaphylaxis and Other (See Comments)    **Patient received ceftriaxone 1g IV in ED on 07/08/17 w/o ADR Has patient had a PCN reaction causing immediate rash, facial/tongue/throat swelling, SOB or lightheadedness with hypotension: Yes Has patient had a PCN reaction causing severe rash involving mucus membranes or skin necrosis: Unknown Has patient had a PCN reaction that required hospitalization: Yes Has patient had a PCN reaction occurring within the last 10 years: No If all above answers are "NO", then may proceed with Cephalosporin use.  Marland Kitchen  Tape Rash    REVIEW OF SYSTEMS:   ROS unobtainable due to patient's clinical condition  MEDICATIONS AT HOME:   Prior to Admission medications   Medication Sig Start Date End Date Taking? Authorizing Provider  ADVAIR DISKUS 500-50 MCG/DOSE AEPB Inhale 1 puff into the lungs 2 (two) times daily. 09/26/17  Yes [provider]  albuterol (PROVENTIL HFA;VENTOLIN HFA) 108 (90 Base) MCG/ACT inhaler Inhale 2 puffs into the lungs every 6 (six) hours as needed for wheezing or shortness of breath.   Yes [provider]  baclofen (LIORESAL) 20 MG tablet Take 20 mg by mouth every 8 (eight) hours as needed for muscle spasms.     Yes [provider]  divalproex (DEPAKOTE) 500 MG DR tablet Take 1 tablet (500 mg total) by mouth every 12 (twelve) hours. 06/14/18  Yes Holliday Sheaffer, MD  DULoxetine (CYMBALTA) 60 MG capsule Take 60 mg by mouth at bedtime.   Yes [provider]  escitalopram (LEXAPRO) 20 MG tablet Take 20 mg by mouth daily.   Yes [provider]  gabapentin (NEURONTIN) 600 MG tablet Take 600 mg by mouth 3 (three) times daily. 08/09/18  Yes [provider]  hydrOXYzine (VISTARIL) 25 MG capsule Take 25 mg by mouth 3 (three) times daily as needed for anxiety. 08/10/18  Yes [provider]  montelukast (SINGULAIR) 10 MG tablet Take 10 mg by mouth daily.   Yes [provider]  morphine (MS CONTIN) 60 MG 12 hr tablet Take 60 mg by mouth every 12 (twelve) hours.    Yes [provider]  morphine (MSIR) 30 MG tablet Take 30 mg by mouth 2 (two) times daily as needed for severe pain.   Yes [provider]  MOVANTIK 25 MG TABS tablet Take 25 mg by mouth daily as needed (constipation).  10/19/17  Yes [provider]  pregabalin (LYRICA) 200 MG capsule Take 200 mg by mouth 3 (three) times daily.   Yes [provider]  tiotropium (SPIRIVA) 18 MCG inhalation capsule Place 18 mcg into inhaler and inhale daily.   Yes [provider]  traZODone (DESYREL) 50 MG tablet Take 100 mg by mouth at bedtime. 08/10/18  Yes [provider]      VITAL SIGNS:  Blood pressure 95/60, pulse (!) 59, temperature 98.4 F (36.9 C), temperature source Oral, resp. rate 11, weight 90 kg, SpO2 100 %.  PHYSICAL EXAMINATION:  Physical Exam  GENERAL:  57 y.o.-year-old patient lying in the bed with no acute distress.  Very drowsy but arousable. EYES: Pupils equal, round, reactive to light and accommodation. No scleral icterus. Extraocular muscles intact.  HEENT: Head atraumatic, normocephalic. Oropharynx and nasopharynx clear.  NECK:  Supple, no jugular  venous distention. No thyroid enlargement, no tenderness.  LUNGS: Normal breath sounds bilaterally, no wheezing, rales,rhonchi or crepitation. No use of accessory muscles of respiration.  CARDIOVASCULAR: S1, S2 normal. No murmurs, rubs, or gallops.  ABDOMEN: Soft, nontender, nondistended. Bowel sounds present. No organomegaly or mass.  EXTREMITIES: No pedal edema, cyanosis, or clubbing.  NEUROLOGIC: Very drowsy but arousable.  Moving all extremities with no focal deficit.  Full neuro examination not done at this time.  Gait not checked.  PSYCHIATRIC: Patient disoriented.  Lethargic.  Arousable. SKIN: No obvious rash, lesion, or ulcer.   LABORATORY PANEL:   CBC Recent Labs  Lab 08/22/18 1140  WBC 4.0  HGB 14.3  HCT 41.4  PLT 188   ------------------------------------------------------------------------------------------------------------------  Chemistries  Recent Labs  Lab 08/22/18 1140 08/22/18 1201  NA 140  --   K 3.9  --   CL 99  --   CO2 30  --   GLUCOSE 112*  --   BUN 10  --   CREATININE 0.60  --   CALCIUM 8.9  --   AST  --  15  ALT  --  12  ALKPHOS  --  62  BILITOT  --  0.5   ------------------------------------------------------------------------------------------------------------------  Cardiac Enzymes No results for input(s): TROPONINI in the last 168 hours. ------------------------------------------------------------------------------------------------------------------  RADIOLOGY:  Ct Head Wo Contrast  Result Date: 08/22/2018 CLINICAL DATA:  Syncope and unresponsive. EXAM: CT HEAD WITHOUT CONTRAST TECHNIQUE: Contiguous axial images were obtained from the base of the skull through the vertex without intravenous contrast. COMPARISON:  06/12/2018 FINDINGS: Brain: There is no mass effect, midline shift, or acute intracranial hemorrhage. Brain parenchyma and ventricular system are within normal limits. Vascular: No hyperdense vessel or unexpected calcification.  Skull: The cranium is intact. Sinuses/Orbits: Mastoid air cells are clear. There is layering mucus material in the sphenoid sinuses. Orbits are within normal limits. Other: Noncontributory. IMPRESSION: No acute intracranial pathology. Electronically Signed   By: Marybelle Killings M.D.   On: 08/22/2018 12:34      IMPRESSION AND PLAN:  Patient is a 58 year old female with history of seizures, depression and chronic opiate use on high doses of MS Contin scheduled and intermediate release morphine sulfate as needed was brought into the emergency room with complaints of altered mental status.  Felt to be related to narcotic use  1.  Acute metabolic encephalopathy. Most likely related to narcotics.  Patient on high doses of MS Contin as well as intermediate release morphine.  Had sleep study done last night and narcotics usually not administered during the sleep study.  There is concern that patient may have taken more narcotics after she left the sleep lab this morning prior to the episode. Narcan not administered due to chronic narcotic use to prevent withdrawal. Hemodynamically stable.  Drowsy and lethargic but arousable. Monitor T effect of narcotic with out of the system. CT scan of the head with no acute findings.  Clinically no evidence of infectious process.  Ammonia level normal. N.p.o. for now until more awake for safe p.o. intake  2.  History of seizures. Patient on Depakote.  Requested for Depakote level.  To resume Depakote once patient awake enough to take p.o. intake  3.  History of chronic narcotic use. Patient very drowsy at this time due to effect of narcotics. Hold off on resuming until patient more awake and alert.  4.  Depression Resume home meds when patient more awake and alert.  DVT prophylaxis; Lovenox  All the records are reviewed and case discussed with ED provider. Management plans discussed with the patient, family and they are in agreement.  CODE STATUS: Full code   TOTAL TIME TAKING CARE OF THIS PATIENT: 60 minutes.    Sebastian Lurz M.D on 08/22/2018 at 6:49 PM  Between 7am to 6pm - Pager - [phone]  After 6pm go to [email or web address] - Proofreader  [facility]  Office  [phone]  CC: Primary care physician; Charlann Boxer, MD   Note: This dictation was prepared with Dragon dictation along with smaller phrase technology. Any transcriptional errors that result from this process are unintentional.

## 2018-08-22 NOTE — ED Provider Notes (Signed)
[facility] Emergency Department Provider Note  ____________________________________________  Time seen: Approximately 12:20 PM  I have reviewed the triage vital signs and the nursing notes.   HISTORY  Chief Complaint Loss of Consciousness  Level 5 caveat:  Portions of the history and physical were unable to be obtained due to AMS   HPI Rebecca Colon is a 58 y.o. female with history of seizures, depression, chronic opiate use who presents for altered mental status.  Patient has been seen at least 3 times in the past for similar presentation.  Thought to be multifactorial including narcotic withdrawal and seizure.  Patient is brought in today via EMS after having a seizure at home.  Patient was returning home after a sleep study performed at [facility] and this episode happened in route to her house.  No fall or trauma.  Vitals normal per EMS with blood glucose of 111. No urinary or bowel loss.  Patient is not answering any questions at this time.  Past Medical History:  Diagnosis Date  . Anemia   . Anxiety   . Asthma   . Breast cancer ([place])   . 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 Depression   . DVT (deep venous thrombosis) ([place])   . Sciatic nerve injury   . Seizures [facility])     Patient Active Problem List   Diagnosis Date Noted  . AMS (altered mental status) 06/12/2018  . Cellulitis of breast 11/12/2017  . Cellulitis 11/11/2017  . Acute encephalopathy 07/08/2017  . UTI (urinary tract infection) 07/08/2017  . Anxiety 07/08/2017  . Depression 07/08/2017    Past Surgical History:  Procedure Laterality Date  . CERVICAL FUSION    . FASCIOTOMY Left   . LUMBAR FUSION    . MASTECTOMY    . SPINAL CORD STIMULATOR IMPLANT    . TONSILLECTOMY      Prior to Admission medications   Medication Sig Start Date End Date Taking? Authorizing Provider  ADVAIR DISKUS 500-50 MCG/DOSE AEPB Inhale 1 puff into the lungs 2 (two) times daily. 09/26/17   Yes [provider]  albuterol (PROVENTIL HFA;VENTOLIN HFA) 108 (90 Base) MCG/ACT inhaler Inhale 2 puffs into the lungs every 6 (six) hours as needed for wheezing or shortness of breath.   Yes [provider]  baclofen (LIORESAL) 20 MG tablet Take 20 mg by mouth every 8 (eight) hours as needed for muscle spasms.    Yes [provider]  divalproex (DEPAKOTE) 500 MG DR tablet Take 1 tablet (500 mg total) by mouth every 12 (twelve) hours. 06/14/18  Yes Ojie, Jude, MD  DULoxetine (CYMBALTA) 60 MG capsule Take 60 mg by mouth at bedtime.   Yes [provider]  escitalopram (LEXAPRO) 20 MG tablet Take 20 mg by mouth daily.   Yes [provider]  gabapentin (NEURONTIN) 600 MG tablet Take 600 mg by mouth 3 (three) times daily. 08/09/18  Yes [provider]  hydrOXYzine (VISTARIL) 25 MG capsule Take 25 mg by mouth 3 (three) times daily as needed for anxiety. 08/10/18  Yes [provider]  montelukast (SINGULAIR) 10 MG tablet Take 10 mg by mouth daily.   Yes [provider]  morphine (MS CONTIN) 60 MG 12 hr tablet Take 60 mg by mouth every 12 (twelve) hours.    Yes [provider]  morphine (MSIR) 30 MG tablet Take 30 mg by mouth 2 (two) times daily as needed for severe pain.   Yes  [provider]  MOVANTIK 25 MG TABS tablet Take 25 mg by mouth daily as needed (constipation).  10/19/17  Yes [provider]  pregabalin (LYRICA) 200 MG capsule Take 200 mg by mouth 3 (three) times daily.   Yes [provider]  tiotropium (SPIRIVA) 18 MCG inhalation capsule Place 18 mcg into inhaler and inhale daily.   Yes [provider]  traZODone (DESYREL) 50 MG tablet Take 100 mg by mouth at bedtime. 08/10/18  Yes [provider]    Allergies Other; Penicillins; and Tape  Family History  Problem Relation Age of Onset  . COPD Mother   . Heart disease Mother   . Hypertension Mother   . COPD Father   .  Heart disease Father   . Hypertension Father     Social History Social History   Tobacco Use  . Smoking status: Former Smoker    Last attempt to quit: 09/12/2012    Years since quitting: 5.9  . Smokeless tobacco: Never Used  Substance Use Topics  . Alcohol use: Yes  . Drug use: No    Review of Systems  Constitutional: Negative for fever. Neurological: + seizure  ____________________________________________   PHYSICAL EXAM:  VITAL SIGNS: ED Triage Vitals  Enc Vitals Group     BP 08/22/18 1134 129/72     Pulse Rate 08/22/18 1134 65     Resp 08/22/18 1134 10     Temp 08/22/18 1134 98.4 F (36.9 C)     Temp Source 08/22/18 1134 Oral     SpO2 08/22/18 1134 100 %     Weight 08/22/18 1133 198 lb 6.6 oz (90 kg)     Height --      Head Circumference --      Peak Flow --      Pain Score --      Pain Loc --      Pain Edu? --      Excl. in [place]? --     Constitutional: Sleeping but arousable, confused, no distress HEENT:      Head: Normocephalic and atraumatic.         Eyes: Conjunctivae are normal. Sclera is non-icteric. Pupils 98mm and reactive bilaterally      Mouth/Throat: Mucous membranes are moist.       Neck: Supple with no signs of meningismus. Cardiovascular: Regular rate and rhythm. No murmurs, gallops, or rubs.  Respiratory: Normal respiratory effort. Lungs are clear to auscultation bilaterally. No wheezes, crackles, or rhonchi.  Gastrointestinal: Soft, non tender, and non distended with positive bowel sounds. No rebound or guarding. Musculoskeletal: Nontender with normal range of motion in all extremities. No edema, cyanosis, or erythema of extremities. Neurologic: GCS 11, following commands, does not open eyes, confused Skin: Skin is warm, dry and intact. No rash noted.  ____________________________________________   LABS (all labs ordered are listed, but only abnormal results are displayed)  Labs Reviewed  BASIC METABOLIC PANEL - Abnormal; Notable for  the following components:      Result Value   Glucose, Bld 112 (*)    All other components within normal limits  URINALYSIS, COMPLETE (UACMP) WITH MICROSCOPIC - Abnormal; Notable for the following components:   Color, Urine YELLOW (*)    APPearance CLEAR (*)    All other components within normal limits  BLOOD GAS, VENOUS - Abnormal; Notable for the following components:   Bicarbonate 34.7 (*)    Acid-Base Excess 7.4 (*)    All other components within  normal limits  HEPATIC FUNCTION PANEL - Abnormal; Notable for the following components:   Total Protein 6.3 (*)    All other components within normal limits  ACETAMINOPHEN LEVEL - Abnormal; Notable for the following components:   Acetaminophen (Tylenol), Serum <10 (*)    All other components within normal limits  URINE DRUG SCREEN, QUALITATIVE ([facility] ONLY) - Abnormal; Notable for the following components:   Opiate, Ur Screen POSITIVE (*)    All other components within normal limits  SARS CORONAVIRUS 2 (HOSPITAL ORDER, [place] [facility])  CBC  AMMONIA  SALICYLATE LEVEL  ETHANOL   ____________________________________________  EKG  ED ECG REPORT I, Rudene Re, the attending physician, personally viewed and interpreted this ECG.  Normal sinus rhythm, rate of 65, normal intervals, normal axis, low voltage QRS, no ST elevations or depressions.  No significant changes when compared to prior. ____________________________________________  RADIOLOGY  I have personally reviewed the images performed during this visit and I agree with the Radiologist's read.   Interpretation by Radiologist:  Ct Head Wo Contrast  Result Date: 08/22/2018 CLINICAL DATA:  Syncope and unresponsive. EXAM: CT HEAD WITHOUT CONTRAST TECHNIQUE: Contiguous axial images were obtained from the base of the skull through the vertex without intravenous contrast. COMPARISON:  06/12/2018 FINDINGS: Brain: There is no mass effect, midline shift, or  acute intracranial hemorrhage. Brain parenchyma and ventricular system are within normal limits. Vascular: No hyperdense vessel or unexpected calcification. Skull: The cranium is intact. Sinuses/Orbits: Mastoid air cells are clear. There is layering mucus material in the sphenoid sinuses. Orbits are within normal limits. Other: Noncontributory. IMPRESSION: No acute intracranial pathology. Electronically Signed   By: Marybelle Killings M.D.   On: 08/22/2018 12:34      ____________________________________________   PROCEDURES  Procedure(s) performed: None Procedures Critical Care performed:  None ____________________________________________   INITIAL IMPRESSION / ASSESSMENT AND PLAN / ED COURSE  58 y.o. female with history of seizures, depression, chronic opiate use who presents for AMS after having what sounds like a seizure en route to her home after a sleep study at [facility]. Patient is post-ictal, GCS 11.  Protecting her airway.  Being monitored closely.  EKG with no evidence of ischemia or dysrhythmias.  Patient has had prior hospitalizations for similar encephalopathy thought to be multifactorial including seizures and opiate withdrawals.  No signs of trauma.  Head CT showing no acute findings. Labs pending. Will monitor for return to baseline. Patient not actively seizing at this time.     _________________________ 2:08 PM on 08/22/2018 -----------------------------------------  Labs within normal limits.  Drug screen positive for opiates.  Ammonia VBG within normal limits.  Patient remains with a GCS of 11 and unchanged neuro exam.  Her COVID swab is pending.  We will continue to monitor.  If patient is still postictal once her COVID swab is back will admit to the hospitalist service. Care transferred to Dr. Cinda Quest   As part of my medical decision making, I reviewed the following data within the [place] notes reviewed and incorporated, Labs reviewed , EKG interpreted  , Old EKG reviewed, Old chart reviewed, Radiograph reviewed , Notes from prior ED visits and [place] Controlled Substance Database    Pertinent labs & imaging results that were available during my care of the patient were reviewed by me and considered in my medical decision making (see chart for details).    ____________________________________________   FINAL CLINICAL IMPRESSION(S) / ED DIAGNOSES  Final diagnoses:  Seizure ([place])  Postictal state ([place])  Altered mental status, unspecified altered mental status type      NEW MEDICATIONS STARTED DURING THIS VISIT:  ED Discharge Orders    None       Note:  This document was prepared using Dragon voice recognition software and may include unintentional dictation errors.    Alfred Levins, [place], MD 08/22/18 [phone]

## 2018-08-22 NOTE — ED Notes (Signed)
RN called to update patient's daughter. No answer at this time. Rn will call back at a later time.

## 2018-08-22 NOTE — ED Notes (Signed)
Transport to floor room 247.AS

## 2018-08-22 NOTE — ED Triage Notes (Signed)
Pt to ED from home after family reported a syncopal episode where pt went unresponsive and started shaking. Pt has a hx of seizures. EMS reports pt pt was not responding well to them but responded to ammonia and a sternal rub. Pt was returning from [facility] after having a sleep study performed when episode occured.   EMS vitals:  97% RA 64 NSR CBG 111 106/61 repeat 100/48

## 2018-08-22 NOTE — ED Notes (Signed)
MD at bedside. Daughter called back and was updated to this point.

## 2018-08-23 DIAGNOSIS — G40909 Epilepsy, unspecified, not intractable, without status epilepticus: Secondary | ICD-10-CM | POA: Diagnosis not present

## 2018-08-23 DIAGNOSIS — R569 Unspecified convulsions: Secondary | ICD-10-CM | POA: Diagnosis not present

## 2018-08-23 LAB — BASIC METABOLIC PANEL
Anion gap: 7 (ref 5–15)
BUN: 10 mg/dL (ref 6–20)
CO2: 27 mmol/L (ref 22–32)
Calcium: 8.2 mg/dL — ABNORMAL LOW (ref 8.9–10.3)
Chloride: 104 mmol/L (ref 98–111)
Creatinine, Ser: 0.53 mg/dL (ref 0.44–1.00)
GFR calc Af Amer: >60 mL/min (ref >60)
GFR calc non Af Amer: >60 mL/min (ref >60)
Glucose, Bld: 109 mg/dL — ABNORMAL HIGH (ref 70–99)
Potassium: 3.9 mmol/L (ref 3.5–5.1)
Sodium: 138 mmol/L (ref 135–145)

## 2018-08-23 LAB — CBC
HCT: 38.5 % (ref 36.0–46.0)
Hemoglobin: 13 g/dL (ref 12.0–15.0)
MCH: 31.3 pg (ref 26.0–34.0)
MCHC: 33.8 g/dL (ref 30.0–36.0)
MCV: 92.8 fL (ref 80.0–100.0)
Platelets: 171 10*3/uL (ref 150–400)
RBC: 4.15 MIL/uL (ref 3.87–5.11)
RDW: 11.8 % (ref 11.5–15.5)
WBC: 3.2 10*3/uL — ABNORMAL LOW (ref 4.0–10.5)
nRBC: 0 % (ref 0.0–0.2)

## 2018-08-23 LAB — MAGNESIUM: Magnesium: 2.1 mg/dL (ref 1.7–2.4)

## 2018-08-23 MED ORDER — DIVALPROEX SODIUM 500 MG PO DR TAB
500.0000 mg | DELAYED_RELEASE_TABLET | Freq: Two times a day (BID) | ORAL | Status: DC
  Administered 2018-08-23: 500 mg via ORAL
  Filled 2018-08-23 (×2): qty 1

## 2018-08-23 MED ORDER — GABAPENTIN 600 MG PO TABS
600.0000 mg | ORAL_TABLET | Freq: Three times a day (TID) | ORAL | Status: DC
  Administered 2018-08-23: 600 mg via ORAL
  Filled 2018-08-23: qty 1

## 2018-08-23 MED ORDER — DULOXETINE HCL 30 MG PO CPEP: 60.0000 mg | ORAL_CAPSULE | Freq: Every day | ORAL | Status: DC

## 2018-08-23 NOTE — Plan of Care (Signed)

## 2018-08-23 NOTE — Progress Notes (Signed)
Advanced care plan. Purpose of the Encounter: CODE STATUS Parties in Attendance:Patient Patient's Decision Capacity:Good Subjective/Patient's story: Rebecca Colon  is a 58 y.o. female with a known history of seizures, depression and chronic opiate use on high doses of MS Contin scheduled and intermediate release morphine sulfate as needed was brought into the emergency room with complaints of altered mental status.  Patient should have had at least 3 episodes of similar presentations in the past.  Further history obtained from nursing staff in the emergency room who confirmed patient had a sleep study done at [facility] yesterday.  There is suspicion that patient may have taken extra narcotics this morning because she would not be allowed to take narcotics during the sleep study.  Patient was reported to have become more confused in route to her house.  EMS was called.  Blood sugar was 111.  No urinary or bowel incontinence to suggest patient being postictal.  Patient was evaluated in the emergency room and work-up done supine emergency room including CT scan of the head with no acute findings.  Narcan was not given given patient's chronic narcotic use to prevent acute withdrawal.  Medical service called to monitor patient until effect of narcotic wears out of the system.  Patient becoming more gradually arousable.  Hemodynamically stable. Objective/Medical story Patient needs work up with CT head.  Clinical monitoring Goals of care determination:  Advance care directives and goals of care discussed. Patient wants everything done which includes cpr, intubation and ventilator if need arises. CODE STATUS: Full code Time spent discussing advanced care planning: 16 minutes

## 2018-08-23 NOTE — Plan of Care (Signed)
  Problem: Education: Goal: Knowledge of General Education information will improve Description: Including pain rating scale, medication(s)/side effects and non-pharmacologic comfort measures Outcome: Progressing   Problem: Safety: Goal: Ability to remain free from injury will improve Outcome: Progressing   

## 2018-08-23 NOTE — Progress Notes (Signed)
Patient discharged home today, Daughter/legal guardian Ria Comment notified AVS given to patient after reviewing medication and appointment information. No concerns at this time.

## 2018-08-23 NOTE — Discharge Summary (Signed)
[facility] NAME: Rebecca Colon    MR#:  [id number]  DATE OF BIRTH:  1961-01-29  DATE OF ADMISSION:  08/22/2018 ADMITTING PHYSICIAN: Otila Back, MD  DATE OF DISCHARGE: 08/23/2018  PRIMARY CARE PHYSICIAN: Charlann Boxer, MD   ADMISSION DIAGNOSIS:  Seizure ([place]) [R56.9] Postictal state ([place]) [R56.9] Altered mental status, unspecified altered mental status type [R41.82]  DISCHARGE DIAGNOSIS:  Active Problems:   Acute metabolic encephalopathy History of seizure disorder Breast cancer Bronchial asthma Anxiety disorder  SECONDARY DIAGNOSIS:   Past Medical History:  Diagnosis Date  . Anemia   . Anxiety   . Asthma   . Breast cancer ([place])   . Compartment syndrome of left lower extremity ([place])   . Depression   . DVT (deep venous thrombosis) ([place])   . Sciatic nerve injury   . Seizures ([place])      ADMITTING HISTORY Rebecca Colon  is a 58 y.o. female with a known history of seizures, depression and chronic opiate use on high doses of MS Contin scheduled and intermediate release morphine sulfate as needed was brought into the emergency room with complaints of altered mental status.  Patient should have had at least 3 episodes of similar presentations in the past.  Further history obtained from nursing staff in the emergency room who confirmed patient had a sleep study done at [facility] yesterday.  There is suspicion that patient may have taken extra narcotics this morning because she would not be allowed to take narcotics during the sleep study.  Patient was reported to have become more confused in route to her house.  EMS was called.  Blood sugar was 111.  No urinary or bowel incontinence to suggest patient being postictal.  Patient was evaluated in the emergency room and work-up done supine emergency room including CT scan of the head with no acute findings.  Narcan was not given given patient's chronic narcotic use to prevent acute withdrawal.   Medical service called to monitor patient until effect of narcotic wears out of the system.  Patient becoming more gradually arousable.  Hemodynamically stable.   HOSPITAL COURSE:  Patient was worked up with CT head which showed no acute abnormality.  Patient's metabolic encephalopathy probably related to narcotics.  Patient was on high-dose of MS Contin and intermediate release morphine at home.  She became more awake and alert and completely oriented during the hospitalization.  Mental status is back to baseline.  Ammonia level was checked was normal.  Patient was restarted back on Depakote for seizure disorder.  Weaned off oxygen and ambulated well.  Tolerated diet well.  CONSULTS OBTAINED:    DRUG ALLERGIES:   Allergies  Allergen Reactions  . Other Hives    Significant skin reaction to Vicryl suture  . Penicillins Anaphylaxis and Other (See Comments)    **Patient received ceftriaxone 1g IV in ED on 07/08/17 w/o ADR Has patient had a PCN reaction causing immediate rash, facial/tongue/throat swelling, SOB or lightheadedness with hypotension: Yes Has patient had a PCN reaction causing severe rash involving mucus membranes or skin necrosis: Unknown Has patient had a PCN reaction that required hospitalization: Yes Has patient had a PCN reaction occurring within the last 10 years: No If all above answers are "NO", then may proceed with Cephalosporin use.  . Tape Rash    DISCHARGE MEDICATIONS:   Allergies as of 08/23/2018      Reactions   Other Hives   Significant skin reaction to  Vicryl suture   Penicillins Anaphylaxis, Other (See Comments)   **Patient received ceftriaxone 1g IV in ED on 07/08/17 w/o ADR Has patient had a PCN reaction causing immediate rash, facial/tongue/throat swelling, SOB or lightheadedness with hypotension: Yes Has patient had a PCN reaction causing severe rash involving mucus membranes or skin necrosis: Unknown Has patient had a PCN reaction that required  hospitalization: Yes Has patient had a PCN reaction occurring within the last 10 years: No If all above answers are "NO", then may proceed with Cephalosporin use.   Tape Rash      Medication List    STOP taking these medications   morphine 30 MG tablet Commonly known as:  MSIR   morphine 60 MG 12 hr tablet Commonly known as:  MS CONTIN     TAKE these medications   Advair Diskus 500-50 MCG/DOSE Aepb Generic drug:  Fluticasone-Salmeterol Inhale 1 puff into the lungs 2 (two) times daily.   albuterol 108 (90 Base) MCG/ACT inhaler Commonly known as:  VENTOLIN HFA Inhale 2 puffs into the lungs every 6 (six) hours as needed for wheezing or shortness of breath.   baclofen 20 MG tablet Commonly known as:  LIORESAL Take 20 mg by mouth every 8 (eight) hours as needed for muscle spasms.   divalproex 500 MG DR tablet Commonly known as:  DEPAKOTE Take 1 tablet (500 mg total) by mouth every 12 (twelve) hours.   DULoxetine 60 MG capsule Commonly known as:  CYMBALTA Take 60 mg by mouth at bedtime.   escitalopram 20 MG tablet Commonly known as:  LEXAPRO Take 20 mg by mouth daily.   gabapentin 600 MG tablet Commonly known as:  NEURONTIN Take 600 mg by mouth 3 (three) times daily.   hydrOXYzine 25 MG capsule Commonly known as:  VISTARIL Take 25 mg by mouth 3 (three) times daily as needed for anxiety.   montelukast 10 MG tablet Commonly known as:  SINGULAIR Take 10 mg by mouth daily.   Movantik 25 MG Tabs tablet Generic drug:  naloxegol oxalate Take 25 mg by mouth daily as needed (constipation).   pregabalin 200 MG capsule Commonly known as:  LYRICA Take 200 mg by mouth 3 (three) times daily.   tiotropium 18 MCG inhalation capsule Commonly known as:  SPIRIVA Place 18 mcg into inhaler and inhale daily.   traZODone 50 MG tablet Commonly known as:  DESYREL Take 100 mg by mouth at bedtime.       Today Patient seen and evaluated Weaned off oxygen Tolerating diet  well Hemodynamically stable  VITAL SIGNS:  Blood pressure 129/63, pulse 62, temperature 98.2 F (36.8 C), temperature source Oral, resp. rate 18, weight 90.9 kg, SpO2 100 %.  I/O:    Intake/Output Summary (Last 24 hours) at 08/23/2018 1349 Last data filed at 08/23/2018 0610 Gross per 24 hour  Intake 1652.98 ml  Output 1 ml  Net 1651.98 ml    PHYSICAL EXAMINATION:  Physical Exam  GENERAL:  58 y.o.-year-old patient lying in the bed with no acute distress.  LUNGS: Normal breath sounds bilaterally, no wheezing, rales,rhonchi or crepitation. No use of accessory muscles of respiration.  CARDIOVASCULAR: S1, S2 normal. No murmurs, rubs, or gallops.  ABDOMEN: Soft, non-tender, non-distended. Bowel sounds present. No organomegaly or mass.  NEUROLOGIC: Moves all 4 extremities. PSYCHIATRIC: The patient is alert and oriented x 3.  SKIN: No obvious rash, lesion, or ulcer.   DATA REVIEW:   CBC Recent Labs  Lab 08/23/18 0441  WBC 3.2*  HGB 13.0  HCT 38.5  PLT 171    Chemistries  Recent Labs  Lab 08/22/18 1201 08/23/18 0441  NA  --  138  K  --  3.9  CL  --  104  CO2  --  27  GLUCOSE  --  109*  BUN  --  10  CREATININE  --  0.53  CALCIUM  --  8.2*  MG  --  2.1  AST 15  --   ALT 12  --   ALKPHOS 62  --   BILITOT 0.5  --     Cardiac Enzymes No results for input(s): TROPONINI in the last 168 hours.  Microbiology Results  Results for orders placed or performed during the hospital encounter of 08/22/18  SARS Coronavirus 2 (CEPHEID - Performed in [facility] hospital lab), Hosp Order     Status: None   Collection Time: 08/22/18  2:08 PM  Result Value Ref Range Status   SARS Coronavirus 2 NEGATIVE NEGATIVE Final    Comment: (NOTE) If result is NEGATIVE SARS-CoV-2 target nucleic acids are NOT DETECTED. The SARS-CoV-2 RNA is generally detectable in upper and lower  respiratory specimens during the acute phase of infection. The lowest  concentration of SARS-CoV-2 viral  copies this assay can detect is 250  copies / mL. A negative result does not preclude SARS-CoV-2 infection  and should not be used as the sole basis for treatment or other  patient management decisions.  A negative result may occur with  improper specimen collection / handling, submission of specimen other  than nasopharyngeal swab, presence of viral mutation(s) within the  areas targeted by this assay, and inadequate number of viral copies  (<250 copies / mL). A negative result must be combined with clinical  observations, patient history, and epidemiological information. If result is POSITIVE SARS-CoV-2 target nucleic acids are DETECTED. The SARS-CoV-2 RNA is generally detectable in upper and lower  respiratory specimens dur ing the acute phase of infection.  Positive  results are indicative of active infection with SARS-CoV-2.  Clinical  correlation with patient history and other diagnostic information is  necessary to determine patient infection status.  Positive results do  not rule out bacterial infection or co-infection with other viruses. If result is PRESUMPTIVE POSTIVE SARS-CoV-2 nucleic acids MAY BE PRESENT.   A presumptive positive result was obtained on the submitted specimen  and confirmed on repeat testing.  While 2019 novel coronavirus  (SARS-CoV-2) nucleic acids may be present in the submitted sample  additional confirmatory testing may be necessary for epidemiological  and / or clinical management purposes  to differentiate between  SARS-CoV-2 and other Sarbecovirus currently known to infect humans.  If clinically indicated additional testing with an alternate test  methodology [phone]) is advised. The SARS-CoV-2 RNA is generally  detectable in upper and lower respiratory sp ecimens during the acute  phase of infection. The expected result is Negative. Fact Sheet for Patients:  StrictlyIdeas.no Fact Sheet for Healthcare  Providers: BankingDealers.co.za This test is not yet approved or cleared by the [place] FDA and has been authorized for detection and/or diagnosis of SARS-CoV-2 by FDA under an Emergency Use Authorization (EUA).  This EUA will remain in effect (meaning this test can be used) for the duration of the COVID-19 declaration under Section 564(b)(1) of the Act, 21 U.S.C. section 360bbb-3(b)(1), unless the authorization is terminated or revoked sooner. Performed at [facility], [address], [place], [place] [address]  RADIOLOGY:  Ct Head Wo Contrast  Result Date: 08/22/2018 CLINICAL DATA:  Syncope and unresponsive. EXAM: CT HEAD WITHOUT CONTRAST TECHNIQUE: Contiguous axial images were obtained from the base of the skull through the vertex without intravenous contrast. COMPARISON:  06/12/2018 FINDINGS: Brain: There is no mass effect, midline shift, or acute intracranial hemorrhage. Brain parenchyma and ventricular system are within normal limits. Vascular: No hyperdense vessel or unexpected calcification. Skull: The cranium is intact. Sinuses/Orbits: Mastoid air cells are clear. There is layering mucus material in the sphenoid sinuses. Orbits are within normal limits. Other: Noncontributory. IMPRESSION: No acute intracranial pathology. Electronically Signed   By: Marybelle Killings M.D.   On: 08/22/2018 12:34    Follow up with PCP in 1 week.  Management plans discussed with the patient, family and they are in agreement.  CODE STATUS: Full code    Code Status Orders  (From admission, onward)         Start     Ordered   08/22/18 1846  Full code  Continuous     08/22/18 1845        Code Status History    Date Active Date Inactive Code Status Order ID Comments User Context   06/12/2018 1744 06/14/2018 1953 Full Code [id number]  Bettey Costa, MD ED   11/12/2017 0213 11/12/2017 2257 Full Code [id number]  Lance Coon, MD ED   07/08/2017 0223 07/10/2017 1530 Full  Code [id number]  Lance Coon, MD Inpatient      TOTAL TIME TAKING CARE OF THIS PATIENT ON DAY OF DISCHARGE: more than 35 minutes.   Saundra Shelling M.D on 08/23/2018 at 1:49 PM  Between 7am to 6pm - Pager - [phone]  After 6pm go to [email or web address] - password [email or web address] [facility]  Office  [phone]  CC: Primary care physician; Charlann Boxer, MD  Note: This dictation was prepared with Dragon dictation along with smaller phrase technology. Any transcriptional errors that result from this process are unintentional.

## 2018-08-23 NOTE — Progress Notes (Signed)
Pt was admitted on the floor with no signs of distress. VSS. Call bell and phone is within pt reach. Will continue to monitor.

## 2018-12-02 ENCOUNTER — Emergency Department
Admission: EM | Admit: 2018-12-02 | Discharge: 2018-12-03 | Disposition: A | Discharge status: Home / Self Care | Payer: Medicare Other | Attending: Student | Admitting: Student

## 2018-12-02 ENCOUNTER — Emergency Department: Payer: Medicare Other

## 2018-12-02 ENCOUNTER — Other Ambulatory Visit: Payer: Self-pay

## 2018-12-02 DIAGNOSIS — R55 Syncope and collapse: Secondary | ICD-10-CM | POA: Insufficient documentation

## 2018-12-02 DIAGNOSIS — R296 Repeated falls: Secondary | ICD-10-CM | POA: Insufficient documentation

## 2018-12-02 DIAGNOSIS — Y92009 Unspecified place in unspecified non-institutional (private) residence as the place of occurrence of the external cause: Secondary | ICD-10-CM | POA: Insufficient documentation

## 2018-12-02 DIAGNOSIS — W19XXXA Unspecified fall, initial encounter: Secondary | ICD-10-CM | POA: Diagnosis not present

## 2018-12-02 DIAGNOSIS — J45909 Unspecified asthma, uncomplicated: Secondary | ICD-10-CM | POA: Insufficient documentation

## 2018-12-02 DIAGNOSIS — M25552 Pain in left hip: Secondary | ICD-10-CM | POA: Insufficient documentation

## 2018-12-02 DIAGNOSIS — Z87891 Personal history of nicotine dependence: Secondary | ICD-10-CM | POA: Insufficient documentation

## 2018-12-02 DIAGNOSIS — Z79899 Other long term (current) drug therapy: Secondary | ICD-10-CM | POA: Diagnosis not present

## 2018-12-02 DIAGNOSIS — M25512 Pain in left shoulder: Secondary | ICD-10-CM | POA: Diagnosis not present

## 2018-12-02 DIAGNOSIS — Y999 Unspecified external cause status: Secondary | ICD-10-CM | POA: Insufficient documentation

## 2018-12-02 DIAGNOSIS — Y939 Activity, unspecified: Secondary | ICD-10-CM | POA: Insufficient documentation

## 2018-12-02 DIAGNOSIS — M25532 Pain in left wrist: Secondary | ICD-10-CM | POA: Insufficient documentation

## 2018-12-02 LAB — BASIC METABOLIC PANEL
Anion gap: 7 (ref 5–15)
BUN: 10 mg/dL (ref 6–20)
CO2: 29 mmol/L (ref 22–32)
Calcium: 9.4 mg/dL (ref 8.9–10.3)
Chloride: 104 mmol/L (ref 98–111)
Creatinine, Ser: 0.74 mg/dL (ref 0.44–1.00)
GFR calc Af Amer: >60 mL/min (ref >60)
GFR calc non Af Amer: >60 mL/min (ref >60)
Glucose, Bld: 119 mg/dL — ABNORMAL HIGH (ref 70–99)
Potassium: 4.2 mmol/L (ref 3.5–5.1)
Sodium: 140 mmol/L (ref 135–145)

## 2018-12-02 LAB — CBC
HCT: 43.4 % (ref 36.0–46.0)
Hemoglobin: 15.3 g/dL — ABNORMAL HIGH (ref 12.0–15.0)
MCH: 31.7 pg (ref 26.0–34.0)
MCHC: 35.3 g/dL (ref 30.0–36.0)
MCV: 89.9 fL (ref 80.0–100.0)
Platelets: 176 10*3/uL (ref 150–400)
RBC: 4.83 MIL/uL (ref 3.87–5.11)
RDW: 11.9 % (ref 11.5–15.5)
WBC: 5.4 10*3/uL (ref 4.0–10.5)
nRBC: 0 % (ref 0.0–0.2)

## 2018-12-02 LAB — VALPROIC ACID LEVEL: Valproic Acid Lvl: 92 ug/mL (ref 50.0–100.0)

## 2018-12-02 LAB — GLUCOSE, CAPILLARY: Glucose-Capillary: 118 mg/dL — ABNORMAL HIGH (ref 70–99)

## 2018-12-02 MED ORDER — SODIUM CHLORIDE 0.9 % IV BOLUS
1000.0000 mL | Freq: Once | INTRAVENOUS | Status: AC
  Administered 2018-12-02: 1000 mL via INTRAVENOUS

## 2018-12-02 NOTE — ED Notes (Signed)
Pt denies having legal guardian.  °

## 2018-12-02 NOTE — Discharge Instructions (Addendum)
Thank you for letting us take care of you in the emergency department today.   Please continue to take your regular, prescribed medications.   Please follow up with: - Your Neurology doctor to review your ER visit and follow up on your symptoms.  - Your primary care doctor  - Your therapist  See if they can refer you to a psychiatrist to help with your symptoms as well!  Please return to the ER for any new or worsening symptoms.

## 2018-12-02 NOTE — ED Provider Notes (Signed)
[facility] Emergency Department Provider Note  ____________________________________________   First MD Initiated Contact with Patient 12/02/18 2124     (approximate)  I have reviewed the triage vital signs and the nursing notes.  History  Chief Complaint Loss of Consciousness    HPI Rebecca Colon is a 58 y.o. female with history of seizures, asthma, anxiety, depression, PTSD who presents for falls.  Patient states she has fallen twice in the last day or two.  She is unsure of why she fell.  She denies any preceding presyncopal symptoms including lightheadedness, dizziness, chest pain, shortness of breath.  No weakness, numbness, tingling.  She denies any syncope either before or after the falls.  She states she was awake and alert both before and after each fall.  She lives alone and therefore the falls were not witnessed.  She reports compliance with her seizure medications.  She does not think she had any seizure activity surrounding the falls, but states she is unsure because she states her seizures are atypical in that they occur while she is sleeping. She specifically states she was not sleeping when she fell.   She reports moderate soreness to her left shoulder, left wrist, and left hip as a result of the fall.  She states she is tired because she has not been getting good sleep lately.  She is extremely anxious about dying in her sleep, or having a seizure during her sleep and not having anybody at home with her. She denies any SI, HI, hallucinations.          Past Medical Hx Past Medical History:  Diagnosis Date  . Anemia   . Anxiety   . Asthma   . Breast cancer ([place])   . Compartment syndrome of left lower extremity ([place])   . Depression   . DVT (deep venous thrombosis) ([place])   . Sciatic nerve injury   . Seizures [facility])     Problem List Patient Active Problem List   Diagnosis Date Noted  . Acute metabolic encephalopathy [id number]  . AMS  (altered mental status) 06/12/2018  . Cellulitis of breast 11/12/2017  . Cellulitis 11/11/2017  . Acute encephalopathy 07/08/2017  . UTI (urinary tract infection) 07/08/2017  . Anxiety 07/08/2017  . Depression 07/08/2017    Past Surgical Hx Past Surgical History:  Procedure Laterality Date  . CERVICAL FUSION    . FASCIOTOMY Left   . LUMBAR FUSION    . MASTECTOMY    . SPINAL CORD STIMULATOR IMPLANT    . TONSILLECTOMY      Medications Prior to Admission medications   Medication Sig Start Date End Date Taking? Authorizing Provider  ADVAIR DISKUS 500-50 MCG/DOSE AEPB Inhale 1 puff into the lungs 2 (two) times daily. 09/26/17   [provider]  albuterol (PROVENTIL HFA;VENTOLIN HFA) 108 (90 Base) MCG/ACT inhaler Inhale 2 puffs into the lungs every 6 (six) hours as needed for wheezing or shortness of breath.    [provider]  baclofen (LIORESAL) 20 MG tablet Take 20 mg by mouth every 8 (eight) hours as needed for muscle spasms.     [provider]  divalproex (DEPAKOTE) 500 MG DR tablet Take 1 tablet (500 mg total) by mouth every 12 (twelve) hours. 06/14/18   Stark Jock Jude, MD  DULoxetine (CYMBALTA) 60 MG capsule Take 60 mg by mouth at bedtime.    [provider]  escitalopram (LEXAPRO) 20 MG tablet Take 20 mg by mouth daily.  [provider]  gabapentin (NEURONTIN) 600 MG tablet Take 600 mg by mouth 3 (three) times daily. 08/09/18   [provider]  hydrOXYzine (VISTARIL) 25 MG capsule Take 25 mg by mouth 3 (three) times daily as needed for anxiety. 08/10/18   [provider]  montelukast (SINGULAIR) 10 MG tablet Take 10 mg by mouth daily.    [provider]  MOVANTIK 25 MG TABS tablet Take 25 mg by mouth daily as needed (constipation).  10/19/17   [provider]  pregabalin (LYRICA) 200 MG capsule Take 200 mg by mouth 3 (three) times daily.    [provider]  tiotropium (SPIRIVA) 18 MCG inhalation  capsule Place 18 mcg into inhaler and inhale daily.    [provider]  traZODone (DESYREL) 50 MG tablet Take 100 mg by mouth at bedtime. 08/10/18   [provider]    Allergies Other, Penicillins, and Tape  Family Hx Family History  Problem Relation Age of Onset  . COPD Mother   . Heart disease Mother   . Hypertension Mother   . COPD Father   . Heart disease Father   . Hypertension Father     Social Hx Social History   Tobacco Use  . Smoking status: Former Smoker    Quit date: 09/12/2012    Years since quitting: 6.2  . Smokeless tobacco: Never Used  Substance Use Topics  . Alcohol use: Yes  . Drug use: No     Review of Systems  Constitutional: Negative for fever. Negative for chills. Eyes: Negative for visual changes. ENT: Negative for sore throat. Cardiovascular: Negative for chest pain. Respiratory: Negative for shortness of breath. Gastrointestinal: Negative for abdominal pain. Negative for nausea. Negative for vomiting. Genitourinary: Negative for dysuria. Musculoskeletal: + hip, shoulder, wrist pain Skin: Negative for rash. Neurological: Negative for for headaches.   Physical Exam  Vital Signs: ED Triage Vitals [12/02/18 1611]  Enc Vitals Group     BP (!) 122/95     Pulse Rate 85     Resp 18     Temp 99.5 F (37.5 C)     Temp Source Oral     SpO2 98 %     Weight 190 lb (86.2 kg)     Height 5\' 6"  (1.676 m)     Head Circumference      Peak Flow      Pain Score 8     Pain Loc      Pain Edu?      Excl. in [place]?     Constitutional: Alert and oriented.  Eyes: Conjunctivae clear. Sclera anicteric. Head: Normocephalic. Atraumatic. Nose: No congestion. No rhinorrhea. Mouth/Throat: Mucous membranes are moist.  Neck: No stridor.   Cardiovascular: Normal rate, regular rhythm. No murmurs. Extremities well perfused. 2+ symmetric radial pulses. Respiratory: Normal respiratory effort.  Lungs CTAB. Gastrointestinal: Soft and non-tender. No  distention.  Musculoskeletal: No lower extremity edema. No swelling, effusions, or deformity.  Tenderness about the left elbow, left wrist, left hip without associated deformities, swelling, or effusions. Neurologic:  Normal speech and language. No gross focal neurologic deficits are appreciated.  Alert and oriented.  Face symmetric.  Tongue midline.  Cranial nerves II through XII intact. UE and LE strength 5/5 and symmetric. UE and LE SILT.  Skin: Skin is warm, dry and intact. No rash noted. Psychiatric: Mood and affect are appropriate for situation.  EKG  Personally reviewed.   Rate: 72 Rhythm: sinus Axis: normal Intervals: WNL  No acute ischemic changes. Slightly erratic baseline due to patient shaking. No evidence of Brugada, Wolff-Parkinson-White, or prolonged QTC No STEMI    Radiology  CT head: IMPRESSION: Negative exam.  XR hip: IMPRESSION: No acute osseous abnormality about the left hip.  XR wrist: IMPRESSION: No acute osseous abnormality about the left wrist.  XR shoulder: IMPRESSION: No acute osseous abnormality about the left shoulder  Procedures  Procedure(s) performed (including critical care):  Procedures   Initial Impression / Assessment and Plan / ED Course  58 y.o. female who presents to the ED for falls associated left shoulder, wrist, hip pain, as above.  Based on her history, do not feel her falls are related to any seizure-like activity.  No personal history or symptoms of arrhythmia.  Normal neurological exam.  Exam is essentially atraumatic, aside from tenderness at the above locations, which are without evidence of deformity, effusion, or ecchymosis.  Do suspect that significant amount of anxiety is underlying.  No SI, HI, hallucinations, or evidence that she is a danger to herself, do not feel emergent psychiatric evaluation is indicated.  Plan: labs, imaging, reassessment, EKG  Labs without actionable derangements.  Her valproic acid  level is within therapeutic range.  No evidence of arrhythmia on EKG.  Imaging negative for acute injury.  Given negative work-up, will plan for discharge.  Discussed the need for close outpatient follow-up with her Neurologist, which she is agreeable to.  With regards to her nervousness and anxiety, recommended follow-up with her therapist (which she is already established) and PCP to discuss her symptoms, with possible referral to a psychiatrist as well if indicated.  She voices understanding of this and is comfortable with the plan and discharge.    Final Clinical Impression(s) / ED Diagnosis  Final diagnoses:  Fall, initial encounter       Note:  This document was prepared using Dragon voice recognition software and may include unintentional dictation errors.   Lilia Pro., MD 12/03/18 [phone]

## 2018-12-02 NOTE — ED Triage Notes (Addendum)
Reports recent increase in falls. Fall last night due to syncope per patient report. Pt hx of epilepsy, compliant with seizure medications. Pt left hip pain, left shoulder pain and left wrist/forearm since fall last night. Pt reports increased drowsiness today and wanting to sleep. Pt lives alone, so falls were unwitnessed, unknown if she had head trauma with falls. Mild headache. Pt alert and oriented X4, cooperative, RR even and unlabored, color WNL. Pt in NAD.

## 2018-12-02 NOTE — ED Notes (Signed)
Called lab about valproic acid. Lab states red tube still needed. Will place IV and send.

## 2019-03-17 ENCOUNTER — Emergency Department: Payer: Medicare Other

## 2019-03-17 ENCOUNTER — Inpatient Hospital Stay
Admission: EM | Admit: 2019-03-17 | Discharge: 2019-03-20 | DRG: 100 | Disposition: A | Discharge status: Home Health | Payer: Medicare Other | Attending: Family Medicine | Admitting: Family Medicine

## 2019-03-17 ENCOUNTER — Other Ambulatory Visit: Payer: Self-pay

## 2019-03-17 ENCOUNTER — Encounter: Payer: Self-pay | Admitting: Emergency Medicine

## 2019-03-17 DIAGNOSIS — F1190 Opioid use, unspecified, uncomplicated: Secondary | ICD-10-CM

## 2019-03-17 DIAGNOSIS — Z91048 Other nonmedicinal substance allergy status: Secondary | ICD-10-CM

## 2019-03-17 DIAGNOSIS — Z853 Personal history of malignant neoplasm of breast: Secondary | ICD-10-CM

## 2019-03-17 DIAGNOSIS — T50996A Underdosing of other drugs, medicaments and biological substances, initial encounter: Secondary | ICD-10-CM | POA: Diagnosis present

## 2019-03-17 DIAGNOSIS — Y92009 Unspecified place in unspecified non-institutional (private) residence as the place of occurrence of the external cause: Secondary | ICD-10-CM

## 2019-03-17 DIAGNOSIS — G8929 Other chronic pain: Secondary | ICD-10-CM | POA: Diagnosis present

## 2019-03-17 DIAGNOSIS — F4310 Post-traumatic stress disorder, unspecified: Secondary | ICD-10-CM | POA: Diagnosis present

## 2019-03-17 DIAGNOSIS — Z880 Allergy status to penicillin: Secondary | ICD-10-CM

## 2019-03-17 DIAGNOSIS — Z9682 Presence of neurostimulator: Secondary | ICD-10-CM

## 2019-03-17 DIAGNOSIS — J449 Chronic obstructive pulmonary disease, unspecified: Secondary | ICD-10-CM

## 2019-03-17 DIAGNOSIS — F32A Depression, unspecified: Secondary | ICD-10-CM | POA: Diagnosis present

## 2019-03-17 DIAGNOSIS — X58XXXA Exposure to other specified factors, initial encounter: Secondary | ICD-10-CM | POA: Diagnosis present

## 2019-03-17 DIAGNOSIS — S90821A Blister (nonthermal), right foot, initial encounter: Secondary | ICD-10-CM | POA: Diagnosis present

## 2019-03-17 DIAGNOSIS — I6782 Cerebral ischemia: Secondary | ICD-10-CM

## 2019-03-17 DIAGNOSIS — S90822A Blister (nonthermal), left foot, initial encounter: Secondary | ICD-10-CM | POA: Diagnosis present

## 2019-03-17 DIAGNOSIS — Z825 Family history of asthma and other chronic lower respiratory diseases: Secondary | ICD-10-CM

## 2019-03-17 DIAGNOSIS — R4182 Altered mental status, unspecified: Secondary | ICD-10-CM

## 2019-03-17 DIAGNOSIS — G40909 Epilepsy, unspecified, not intractable, without status epilepticus: Principal | ICD-10-CM

## 2019-03-17 DIAGNOSIS — Z87440 Personal history of urinary (tract) infections: Secondary | ICD-10-CM

## 2019-03-17 DIAGNOSIS — Z87891 Personal history of nicotine dependence: Secondary | ICD-10-CM

## 2019-03-17 DIAGNOSIS — Z981 Arthrodesis status: Secondary | ICD-10-CM

## 2019-03-17 DIAGNOSIS — Z7951 Long term (current) use of inhaled steroids: Secondary | ICD-10-CM

## 2019-03-17 DIAGNOSIS — Z79891 Long term (current) use of opiate analgesic: Secondary | ICD-10-CM

## 2019-03-17 DIAGNOSIS — Z20828 Contact with and (suspected) exposure to other viral communicable diseases: Secondary | ICD-10-CM | POA: Diagnosis present

## 2019-03-17 DIAGNOSIS — M797 Fibromyalgia: Secondary | ICD-10-CM | POA: Diagnosis present

## 2019-03-17 DIAGNOSIS — G9340 Encephalopathy, unspecified: Secondary | ICD-10-CM | POA: Diagnosis present

## 2019-03-17 DIAGNOSIS — Z87892 Personal history of anaphylaxis: Secondary | ICD-10-CM

## 2019-03-17 DIAGNOSIS — Z9114 Patient's other noncompliance with medication regimen: Secondary | ICD-10-CM

## 2019-03-17 DIAGNOSIS — G459 Transient cerebral ischemic attack, unspecified: Secondary | ICD-10-CM | POA: Diagnosis not present

## 2019-03-17 DIAGNOSIS — Z86718 Personal history of other venous thrombosis and embolism: Secondary | ICD-10-CM

## 2019-03-17 DIAGNOSIS — F329 Major depressive disorder, single episode, unspecified: Secondary | ICD-10-CM | POA: Diagnosis present

## 2019-03-17 DIAGNOSIS — G4733 Obstructive sleep apnea (adult) (pediatric): Secondary | ICD-10-CM | POA: Diagnosis present

## 2019-03-17 DIAGNOSIS — Z79899 Other long term (current) drug therapy: Secondary | ICD-10-CM

## 2019-03-17 DIAGNOSIS — G9341 Metabolic encephalopathy: Secondary | ICD-10-CM | POA: Diagnosis present

## 2019-03-17 DIAGNOSIS — E039 Hypothyroidism, unspecified: Secondary | ICD-10-CM | POA: Diagnosis present

## 2019-03-17 DIAGNOSIS — I639 Cerebral infarction, unspecified: Secondary | ICD-10-CM | POA: Diagnosis present

## 2019-03-17 DIAGNOSIS — Z91128 Patient's intentional underdosing of medication regimen for other reason: Secondary | ICD-10-CM

## 2019-03-17 DIAGNOSIS — F419 Anxiety disorder, unspecified: Secondary | ICD-10-CM | POA: Diagnosis present

## 2019-03-17 LAB — CBC
HCT: 42.8 % (ref 36.0–46.0)
Hemoglobin: 15 g/dL (ref 12.0–15.0)
MCH: 31.8 pg (ref 26.0–34.0)
MCHC: 35 g/dL (ref 30.0–36.0)
MCV: 90.7 fL (ref 80.0–100.0)
Platelets: 204 10*3/uL (ref 150–400)
RBC: 4.72 MIL/uL (ref 3.87–5.11)
RDW: 11.5 % (ref 11.5–15.5)
WBC: 8.9 10*3/uL (ref 4.0–10.5)
nRBC: 0 % (ref 0.0–0.2)

## 2019-03-17 LAB — COMPREHENSIVE METABOLIC PANEL
ALT: 16 U/L (ref 0–44)
AST: 28 U/L (ref 15–41)
Albumin: 4.1 g/dL (ref 3.5–5.0)
Alkaline Phosphatase: 51 U/L (ref 38–126)
Anion gap: 13 (ref 5–15)
BUN: 17 mg/dL (ref 6–20)
CO2: 25 mmol/L (ref 22–32)
Calcium: 9.4 mg/dL (ref 8.9–10.3)
Chloride: 101 mmol/L (ref 98–111)
Creatinine, Ser: 0.62 mg/dL (ref 0.44–1.00)
GFR calc Af Amer: >60 mL/min (ref >60)
GFR calc non Af Amer: >60 mL/min (ref >60)
Glucose, Bld: 122 mg/dL — ABNORMAL HIGH (ref 70–99)
Potassium: 3.4 mmol/L — ABNORMAL LOW (ref 3.5–5.1)
Sodium: 139 mmol/L (ref 135–145)
Total Bilirubin: 1.3 mg/dL — ABNORMAL HIGH (ref 0.3–1.2)
Total Protein: 7.5 g/dL (ref 6.5–8.1)

## 2019-03-17 LAB — URINALYSIS, ROUTINE W REFLEX MICROSCOPIC
Bilirubin Urine: NEGATIVE
Glucose, UA: NEGATIVE mg/dL
Hgb urine dipstick: NEGATIVE
Ketones, ur: 5 mg/dL — AB
Leukocytes,Ua: NEGATIVE
Nitrite: NEGATIVE
Protein, ur: 30 mg/dL — AB
Specific Gravity, Urine: 1.026 (ref 1.005–1.030)
pH: 5 (ref 5.0–8.0)

## 2019-03-17 LAB — VALPROIC ACID LEVEL: Valproic Acid Lvl: 48 ug/mL — ABNORMAL LOW (ref 50.0–100.0)

## 2019-03-17 MED ORDER — ACETAMINOPHEN 500 MG PO TABS
1000.0000 mg | ORAL_TABLET | Freq: Once | ORAL | Status: AC
  Administered 2019-03-17: 1000 mg via ORAL
  Filled 2019-03-17: qty 2

## 2019-03-17 MED ORDER — DIVALPROEX SODIUM 500 MG PO DR TAB
500.0000 mg | DELAYED_RELEASE_TABLET | Freq: Two times a day (BID) | ORAL | Status: DC
  Administered 2019-03-17 – 2019-03-18 (×2): 500 mg via ORAL
  Filled 2019-03-17 (×3): qty 1

## 2019-03-17 NOTE — ED Notes (Signed)
Patient transported to CT 

## 2019-03-17 NOTE — ED Provider Notes (Signed)
[facility] Emergency Department Provider Note  ____________________________________________   First MD Initiated Contact with Patient 03/17/19 2122     (approximate)  I have reviewed the triage vital signs and the nursing notes.   HISTORY  Chief Complaint Altered Mental Status    HPI Rebecca Colon is a 58 y.o. female with history of seizures who comes in with altered mental status.  Patient was found in the car altered.  It is possible patient may have been in the car since yesterday morning but is unclear.  Patient states that she was in the ER visiting a friend yesterday.  She states that she remembers going out to her car and feeling really tired.  She states that she thinks she drove home and was just really tired and fell asleep.  She states that she still has seizures even when she takes her seizure medications.  She endorses feeling more confused than her baseline.  She states that she has some knee pain that she thinks is from a fall.  Pain is moderate, constant, nothing makes better, nothing makes it worse.    Past Medical History:  Diagnosis Date  . Anemia   . Anxiety   . Asthma   . Breast cancer ([place])   . 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 Depression   . DVT (deep venous thrombosis) ([place])   . Sciatic nerve injury   . Seizures [facility])     Patient Active Problem List   Diagnosis Date Noted  . Acute metabolic encephalopathy [id number]  . AMS (altered mental status) 06/12/2018  . Cellulitis of breast 11/12/2017  . Cellulitis 11/11/2017  . Acute encephalopathy 07/08/2017  . UTI (urinary tract infection) 07/08/2017  . Anxiety 07/08/2017  . Depression 07/08/2017    Past Surgical History:  Procedure Laterality Date  . CERVICAL FUSION    . FASCIOTOMY Left   . LUMBAR FUSION    . MASTECTOMY    . SPINAL CORD STIMULATOR IMPLANT    . TONSILLECTOMY      Prior to Admission medications   Medication Sig Start Date End  Date Taking? Authorizing Provider  ADVAIR DISKUS 500-50 MCG/DOSE AEPB Inhale 1 puff into the lungs 2 (two) times daily. 09/26/17   [provider]  albuterol (PROVENTIL HFA;VENTOLIN HFA) 108 (90 Base) MCG/ACT inhaler Inhale 2 puffs into the lungs every 6 (six) hours as needed for wheezing or shortness of breath.    [provider]  baclofen (LIORESAL) 20 MG tablet Take 20 mg by mouth every 8 (eight) hours as needed for muscle spasms.     [provider]  divalproex (DEPAKOTE) 500 MG DR tablet Take 1 tablet (500 mg total) by mouth every 12 (twelve) hours. 06/14/18   Stark Jock Jude, MD  DULoxetine (CYMBALTA) 60 MG capsule Take 60 mg by mouth at bedtime.    [provider]  escitalopram (LEXAPRO) 20 MG tablet Take 20 mg by mouth daily.    [provider]  gabapentin (NEURONTIN) 600 MG tablet Take 600 mg by mouth 3 (three) times daily. 08/09/18   [provider]  hydrOXYzine (VISTARIL) 25 MG capsule Take 25 mg by mouth 3 (three) times daily as needed for anxiety. 08/10/18   [provider]  montelukast (SINGULAIR) 10 MG tablet Take 10 mg by mouth daily.    [provider]  MOVANTIK 25 MG TABS tablet Take 25 mg by mouth daily as needed (constipation).  10/19/17   [provider]  pregabalin (LYRICA) 200 MG capsule Take 200 mg by mouth 3 (three) times daily.    [provider]  tiotropium (SPIRIVA) 18 MCG inhalation capsule Place 18 mcg into inhaler and inhale daily.    [provider]  traZODone (DESYREL) 50 MG tablet Take 100 mg by mouth at bedtime. 08/10/18   [provider]    Allergies Other, Penicillins, and Tape  Family History  Problem Relation Age of Onset  . COPD Mother   . Heart disease Mother   . Hypertension Mother   . COPD Father   . Heart disease Father   . Hypertension Father     Social History Social History   Tobacco Use  . Smoking status: Former Smoker    Quit date: 09/12/2012      Years since quitting: 6.5  . Smokeless tobacco: Never Used  Substance Use Topics  . Alcohol use: Yes  . Drug use: No      Review of Systems Constitutional: No fever/chills Eyes: No visual changes. ENT: No sore throat. Cardiovascular: Denies chest pain. Respiratory: Denies shortness of breath. Gastrointestinal: No abdominal pain.  No nausea, no vomiting.  No diarrhea.  No constipation. Genitourinary: Negative for dysuria. Musculoskeletal: Negative for back pain.  Positive knee pain Skin: Negative for rash. Neurological: Positive confusion  all other ROS negative ____________________________________________   PHYSICAL EXAM:  VITAL SIGNS: ED Triage Vitals  Enc Vitals Group     BP 03/17/19 2048 (!) 143/71     Pulse Rate 03/17/19 2048 70     Resp 03/17/19 2048 18     Temp 03/17/19 2048 98.5 F (36.9 C)     Temp Source 03/17/19 2048 Oral     SpO2 03/17/19 2048 97 %     Weight 03/17/19 2056 124 lb (56.2 kg)     Height 03/17/19 2056 5\' 7"  (1.702 m)     Head Circumference --      Peak Flow --      Pain Score 03/17/19 2056 0     Pain Loc --      Pain Edu? --      Excl. in [place]? --     Constitutional: Alert and oriented but somewhat confused. Eyes: Conjunctivae are normal. EOMI. Head: Atraumatic. Nose: No congestion/rhinnorhea. Mouth/Throat: Mucous membranes are moist.   Neck: No stridor. Trachea Midline. FROM.  No C-spine tenderness Cardiovascular: Normal rate, regular rhythm. Grossly normal heart sounds.  Good peripheral circulation. Respiratory: Normal respiratory effort.  No retractions. Lungs CTAB. Gastrointestinal: Soft and nontender. No distention. No abdominal bruits.  Musculoskeletal: No lower extremity tenderness nor edema.  No joint effusions.  Tenderness on her bilateral knees. Neurologic:  Normal speech and language. No gross focal neurologic deficits are appreciated.  Equal strength in her arms and her legs.  No deficits noted. Skin:  Skin is warm, dry  and intact. No rash noted. Psychiatric: Mood and affect are normal. Speech and behavior are normal. GU: Deferred   ____________________________________________   LABS (all labs ordered are listed, but only abnormal results are displayed)  Labs Reviewed  COMPREHENSIVE METABOLIC PANEL - Abnormal; Notable for the following components:      Result Value   Potassium 3.4 (*)    Glucose, Bld 122 (*)    Total Bilirubin 1.3 (*)    All other components within normal limits  URINALYSIS, ROUTINE W REFLEX MICROSCOPIC - Abnormal; Notable for the following components:   Color, Urine YELLOW (*)    APPearance  HAZY (*)    Ketones, ur 5 (*)    Protein, ur 30 (*)    Bacteria, UA RARE (*)    All other components within normal limits  VALPROIC ACID LEVEL - Abnormal; Notable for the following components:   Valproic Acid Lvl 48 (*)    All other components within normal limits  CBC - Abnormal; Notable for the following components:   RDW 11.4 (*)    All other components within normal limits  SARS CORONAVIRUS 2 (TAT 6-24 HRS)  CBC  HEMOGLOBIN A1C  LIPID PANEL   ____________________________________________   ED ECG REPORT I, Vanessa Lucky, the attending physician, personally viewed and interpreted this ECG.  EKG is normal sinus, rate of 69, no ST elevation, T wave inversion in aVL and V2, normal intervals.  This looks similar to prior EKG ____________________________________________  RADIOLOGY I, Vanessa [place], personally viewed and evaluated these images (plain radiographs) as part of my medical decision making, as well as reviewing the written report by the radiologist.  ED MD interpretation: X-rays are negative.  Official radiology report(s): CT Head Wo Contrast  Result Date: 03/17/2019 CLINICAL DATA:  Altered mental status, unclear history of seizures EXAM: CT HEAD WITHOUT CONTRAST TECHNIQUE: Contiguous axial images were obtained from the base of the skull through the vertex without  intravenous contrast. COMPARISON:  CT head December 02, 2018 FINDINGS: Brain: Some cortically based hypoattenuation is seen in the left temporal lobe. No other sites concerning for acute infarct. No evidence of hemorrhage, extra-axial collection, mass effect or midline shift. Patchy areas of white matter hypoattenuation are most compatible with chronic microvascular angiopathy. No evidence of acute infarction, hemorrhage, hydrocephalus, extra-axial collection or mass lesion/mass effect. Symmetric prominence of the ventricles, cisterns and sulci compatible with parenchymal volume loss. Patchy areas of white matter hypoattenuation are most compatible with chronic microvascular angiopathy. Vascular: No hyperdense vessel or unexpected calcification. Skull: No calvarial fracture or suspicious osseous lesion. No scalp swelling or hematoma. Sinuses/Orbits: Paranasal sinuses and mastoid air cells are predominantly clear. Included orbital structures are unremarkable. Other: None IMPRESSION: Some cortically based hypoattenuation is seen in the left temporal lobe worrisome for acute/subacute ischemia. Could consider further evaluation with MRI. Features of mild frontal predominant parenchymal volume loss and chronic microvascular angiopathy. Electronically Signed   By: Lovena Le M.D.   On: 03/17/2019 23:30   DG Knee Complete 4 Views Left  Result Date: 03/17/2019 CLINICAL DATA:  Bilateral knee pain status post multiple falls. EXAM: RIGHT KNEE - COMPLETE 4+ VIEW; LEFT KNEE - COMPLETE 4+ VIEW COMPARISON:  None. FINDINGS: There is no acute displaced fracture or dislocation. No significant joint effusion. There are tricompartmental degenerative changes, greatest within the medial compartment. There is no significant prepatellar soft tissue swelling. IMPRESSION: 1. No acute bony abnormality. 2. Tricompartmental degenerative changes, greatest in the medial compartment. Electronically Signed   By: Constance Holster M.D.   On:  03/17/2019 23:43   DG Knee Complete 4 Views Right  Result Date: 03/17/2019 CLINICAL DATA:  Bilateral knee pain status post multiple falls. EXAM: RIGHT KNEE - COMPLETE 4+ VIEW; LEFT KNEE - COMPLETE 4+ VIEW COMPARISON:  None. FINDINGS: There is no acute displaced fracture or dislocation. No significant joint effusion. There are tricompartmental degenerative changes, greatest within the medial compartment. There is no significant prepatellar soft tissue swelling. IMPRESSION: 1. No acute bony abnormality. 2. Tricompartmental degenerative changes, greatest in the medial compartment. Electronically Signed   By: Constance Holster M.D.   On:  03/17/2019 23:43    ____________________________________________   PROCEDURES  Procedure(s) performed (including Critical Care):  Procedures   ____________________________________________   INITIAL IMPRESSION / ASSESSMENT AND PLAN / ED COURSE  SHANTESE LAWES was evaluated in Emergency Department on 03/17/2019 for the symptoms described in the history of present illness. She was evaluated in the context of the global COVID-19 pandemic, which necessitated consideration that the patient might be at risk for infection with the SARS-CoV-2 virus that causes COVID-19. Institutional protocols and algorithms that pertain to the evaluation of patients at risk for COVID-19 are in a state of rapid change based on information released by regulatory bodies including the CDC and federal and state organizations. These policies and algorithms were followed during the patient's care in the ED.    Patient is a 58 year old who came in with altered mental status and possibly last known normal 1 day prior.  Unclear exactly what happened patient does not really remember anything but just feeling tired.  Will get CT head to evaluate for intracranial hemorrhage. Labs to evaluate for electrolyte abnormalities, AKI, UTI.   CT head concerning for acute or subacute ischemic stroke.   Patient is unable to get an MRI due to spinal cord stimulator.  Discussed the patient for admission for observation and stroke risk factor work-up and she is agreeable to this.  Consider still possibility of event from seizure given depakote level is low and given depakote.   D/w hospital for admission.    ____________________________________________   FINAL CLINICAL IMPRESSION(S) / ED DIAGNOSES   Final diagnoses:  Altered mental status, unspecified altered mental status type      MEDICATIONS GIVEN DURING THIS VISIT:  Medications  divalproex (DEPAKOTE) DR tablet 500 mg (500 mg Oral Given 03/17/19 2300)  0.9 %  sodium chloride infusion (has no administration in time range)  enoxaparin (LOVENOX) injection 40 mg (has no administration in time range)  valproic acid (DEPAKENE) 250 MG capsule 500 mg (has no administration in time range)   stroke: mapping our early stages of recovery book (has no administration in time range)  aspirin suppository 300 mg (has no administration in time range)    Or  aspirin EC tablet 325 mg (has no administration in time range)  acetaminophen (TYLENOL) tablet 1,000 mg (1,000 mg Oral Given 03/17/19 2344)     ED Discharge Orders    None       Note:  This document was prepared using Dragon voice recognition software and may include unintentional dictation errors.   Vanessa Rew, MD 03/18/19 [phone]

## 2019-03-17 NOTE — ED Notes (Addendum)
Timeline unclear when last normal was, pt reports she was here yesterday visting friend in ED, states she was falling asleep so her friend told her to go home, pt states she remembers being in the car and trying to get up the brick steps, but was dark and raining really hard.   Pt reports she has hx of seizures, states she has been having increased seizures over the past several days.

## 2019-03-17 NOTE — ED Triage Notes (Signed)
Pt arrived via [facility] with reports of AMS, per EMS, initial call was for seizure activity. However on arrival, pt was found to be shivering.  Pt is alert on arrival, no signs of being post-ictal.  Per EMS, pt was found by neighbor in car at 5am.  Another person reported to EMS that patient may have been in car since yesterday morning.  Pt was here in the ED visiting someone on 03/16/19 visiting girlfriend in the ED.  Pt arrived with robe on and no other clothes besides slippers.  When EMS asked patient her name she gave her daughter's name, when asked the date she said her birthday.  Pt does not remember events that occurred after coming home with friend from hospital.

## 2019-03-18 ENCOUNTER — Encounter: Payer: Self-pay | Admitting: Internal Medicine

## 2019-03-18 ENCOUNTER — Observation Stay
Admit: 2019-03-18 | Discharge: 2019-03-18 | Disposition: A | Discharge status: Home / Self Care | Payer: Medicare Other | Attending: Internal Medicine | Admitting: Internal Medicine

## 2019-03-18 ENCOUNTER — Observation Stay: Payer: Medicare Other

## 2019-03-18 DIAGNOSIS — Z981 Arthrodesis status: Secondary | ICD-10-CM | POA: Diagnosis not present

## 2019-03-18 DIAGNOSIS — F419 Anxiety disorder, unspecified: Secondary | ICD-10-CM | POA: Diagnosis present

## 2019-03-18 DIAGNOSIS — G8929 Other chronic pain: Secondary | ICD-10-CM | POA: Diagnosis present

## 2019-03-18 DIAGNOSIS — R4182 Altered mental status, unspecified: Secondary | ICD-10-CM

## 2019-03-18 DIAGNOSIS — Z79899 Other long term (current) drug therapy: Secondary | ICD-10-CM

## 2019-03-18 DIAGNOSIS — G9341 Metabolic encephalopathy: Secondary | ICD-10-CM

## 2019-03-18 DIAGNOSIS — G40909 Epilepsy, unspecified, not intractable, without status epilepticus: Secondary | ICD-10-CM

## 2019-03-18 DIAGNOSIS — J449 Chronic obstructive pulmonary disease, unspecified: Secondary | ICD-10-CM | POA: Diagnosis present

## 2019-03-18 DIAGNOSIS — G4733 Obstructive sleep apnea (adult) (pediatric): Secondary | ICD-10-CM | POA: Diagnosis present

## 2019-03-18 DIAGNOSIS — F4310 Post-traumatic stress disorder, unspecified: Secondary | ICD-10-CM | POA: Diagnosis present

## 2019-03-18 DIAGNOSIS — Z91128 Patient's intentional underdosing of medication regimen for other reason: Secondary | ICD-10-CM | POA: Diagnosis not present

## 2019-03-18 DIAGNOSIS — Z853 Personal history of malignant neoplasm of breast: Secondary | ICD-10-CM | POA: Diagnosis not present

## 2019-03-18 DIAGNOSIS — E039 Hypothyroidism, unspecified: Secondary | ICD-10-CM | POA: Diagnosis present

## 2019-03-18 DIAGNOSIS — Z9682 Presence of neurostimulator: Secondary | ICD-10-CM | POA: Diagnosis not present

## 2019-03-18 DIAGNOSIS — Z20828 Contact with and (suspected) exposure to other viral communicable diseases: Secondary | ICD-10-CM | POA: Diagnosis present

## 2019-03-18 DIAGNOSIS — Z86718 Personal history of other venous thrombosis and embolism: Secondary | ICD-10-CM | POA: Diagnosis not present

## 2019-03-18 DIAGNOSIS — I6782 Cerebral ischemia: Secondary | ICD-10-CM

## 2019-03-18 DIAGNOSIS — Z7951 Long term (current) use of inhaled steroids: Secondary | ICD-10-CM | POA: Diagnosis not present

## 2019-03-18 DIAGNOSIS — Z91048 Other nonmedicinal substance allergy status: Secondary | ICD-10-CM | POA: Diagnosis not present

## 2019-03-18 DIAGNOSIS — F329 Major depressive disorder, single episode, unspecified: Secondary | ICD-10-CM | POA: Diagnosis present

## 2019-03-18 DIAGNOSIS — Z79891 Long term (current) use of opiate analgesic: Secondary | ICD-10-CM | POA: Diagnosis not present

## 2019-03-18 DIAGNOSIS — T50996A Underdosing of other drugs, medicaments and biological substances, initial encounter: Secondary | ICD-10-CM | POA: Diagnosis present

## 2019-03-18 DIAGNOSIS — S90821A Blister (nonthermal), right foot, initial encounter: Secondary | ICD-10-CM | POA: Diagnosis present

## 2019-03-18 DIAGNOSIS — Y92009 Unspecified place in unspecified non-institutional (private) residence as the place of occurrence of the external cause: Secondary | ICD-10-CM | POA: Diagnosis not present

## 2019-03-18 DIAGNOSIS — F1190 Opioid use, unspecified, uncomplicated: Secondary | ICD-10-CM

## 2019-03-18 DIAGNOSIS — I639 Cerebral infarction, unspecified: Secondary | ICD-10-CM | POA: Diagnosis present

## 2019-03-18 DIAGNOSIS — M797 Fibromyalgia: Secondary | ICD-10-CM | POA: Diagnosis present

## 2019-03-18 DIAGNOSIS — X58XXXA Exposure to other specified factors, initial encounter: Secondary | ICD-10-CM | POA: Diagnosis present

## 2019-03-18 DIAGNOSIS — Z87440 Personal history of urinary (tract) infections: Secondary | ICD-10-CM | POA: Diagnosis not present

## 2019-03-18 DIAGNOSIS — S90822A Blister (nonthermal), left foot, initial encounter: Secondary | ICD-10-CM | POA: Diagnosis present

## 2019-03-18 DIAGNOSIS — G459 Transient cerebral ischemic attack, unspecified: Secondary | ICD-10-CM

## 2019-03-18 LAB — HEMOGLOBIN A1C
Hgb A1c MFr Bld: 5.5 % (ref 4.8–5.6)
Mean Plasma Glucose: 111.15 mg/dL

## 2019-03-18 LAB — LIPID PANEL
Cholesterol: 177 mg/dL (ref 0–200)
HDL: 45 mg/dL (ref >40)
LDL Cholesterol: 112 mg/dL — ABNORMAL HIGH (ref 0–99)
Total CHOL/HDL Ratio: 3.9 RATIO
Triglycerides: 101 mg/dL (ref <150)
VLDL: 20 mg/dL (ref 0–40)

## 2019-03-18 LAB — SARS CORONAVIRUS 2 (TAT 6-24 HRS): SARS Coronavirus 2: NEGATIVE

## 2019-03-18 LAB — CK: Total CK: 453 U/L — ABNORMAL HIGH (ref 38–234)

## 2019-03-18 LAB — CBC
HCT: 39.7 % (ref 36.0–46.0)
Hemoglobin: 14.2 g/dL (ref 12.0–15.0)
MCH: 32.2 pg (ref 26.0–34.0)
MCHC: 35.8 g/dL (ref 30.0–36.0)
MCV: 90 fL (ref 80.0–100.0)
Platelets: 184 K/uL (ref 150–400)
RBC: 4.41 MIL/uL (ref 3.87–5.11)
RDW: 11.4 % — ABNORMAL LOW (ref 11.5–15.5)
WBC: 10.5 K/uL (ref 4.0–10.5)
nRBC: 0 % (ref 0.0–0.2)

## 2019-03-18 LAB — ECHOCARDIOGRAM COMPLETE
Height: 67 in
Weight: 1984 [oz_av]

## 2019-03-18 MED ORDER — ASPIRIN 300 MG RE SUPP
300.0000 mg | Freq: Every day | RECTAL | Status: DC
  Filled 2019-03-18 (×2): qty 1

## 2019-03-18 MED ORDER — ASPIRIN EC 325 MG PO TBEC
325.0000 mg | DELAYED_RELEASE_TABLET | Freq: Every day | ORAL | Status: DC
  Administered 2019-03-18 – 2019-03-20 (×3): 325 mg via ORAL
  Filled 2019-03-18 (×3): qty 1

## 2019-03-18 MED ORDER — DIVALPROEX SODIUM 500 MG PO DR TAB
750.0000 mg | DELAYED_RELEASE_TABLET | Freq: Two times a day (BID) | ORAL | Status: DC
  Administered 2019-03-18: 23:00:00 750 mg via ORAL
  Filled 2019-03-18 (×2): qty 1

## 2019-03-18 MED ORDER — VALPROIC ACID 250 MG PO CAPS
500.0000 mg | ORAL_CAPSULE | Freq: Two times a day (BID) | ORAL | Status: DC
  Administered 2019-03-18 (×2): 500 mg via ORAL
  Filled 2019-03-18 (×3): qty 2

## 2019-03-18 MED ORDER — BACLOFEN 10 MG PO TABS
20.0000 mg | ORAL_TABLET | Freq: Three times a day (TID) | ORAL | Status: DC | PRN
  Administered 2019-03-19: 03:00:00 20 mg via ORAL
  Filled 2019-03-18 (×3): qty 2

## 2019-03-18 MED ORDER — ENOXAPARIN SODIUM 40 MG/0.4ML ~~LOC~~ SOLN
40.0000 mg | SUBCUTANEOUS | Status: DC
  Administered 2019-03-18 – 2019-03-20 (×3): 40 mg via SUBCUTANEOUS
  Filled 2019-03-18 (×2): qty 0.4

## 2019-03-18 MED ORDER — PREGABALIN 75 MG PO CAPS
200.0000 mg | ORAL_CAPSULE | Freq: Three times a day (TID) | ORAL | Status: DC
  Administered 2019-03-18 – 2019-03-20 (×7): 200 mg via ORAL
  Filled 2019-03-18 (×7): qty 1

## 2019-03-18 MED ORDER — DULOXETINE HCL 30 MG PO CPEP
60.0000 mg | ORAL_CAPSULE | Freq: Every day | ORAL | Status: DC
  Administered 2019-03-18 – 2019-03-19 (×2): 60 mg via ORAL
  Filled 2019-03-18 (×2): qty 2

## 2019-03-18 MED ORDER — ALBUTEROL SULFATE (2.5 MG/3ML) 0.083% IN NEBU: 3.0000 mL | INHALATION_SOLUTION | Freq: Four times a day (QID) | RESPIRATORY_TRACT | Status: DC | PRN

## 2019-03-18 MED ORDER — HYDROXYZINE HCL 10 MG PO TABS
25.0000 mg | ORAL_TABLET | Freq: Three times a day (TID) | ORAL | Status: DC | PRN
  Filled 2019-03-18: qty 3

## 2019-03-18 MED ORDER — MONTELUKAST SODIUM 10 MG PO TABS
10.0000 mg | ORAL_TABLET | Freq: Every day | ORAL | Status: DC
  Administered 2019-03-18 – 2019-03-20 (×3): 10 mg via ORAL
  Filled 2019-03-18 (×4): qty 1

## 2019-03-18 MED ORDER — TRAZODONE HCL 50 MG PO TABS
100.0000 mg | ORAL_TABLET | Freq: Every day | ORAL | Status: DC
  Administered 2019-03-18 – 2019-03-19 (×2): 100 mg via ORAL
  Filled 2019-03-18 (×2): qty 2

## 2019-03-18 MED ORDER — SODIUM CHLORIDE 0.9 % IV SOLN
75.0000 mL/h | INTRAVENOUS | Status: DC
  Administered 2019-03-18 (×3): 75 mL/h via INTRAVENOUS

## 2019-03-18 MED ORDER — ATORVASTATIN CALCIUM 20 MG PO TABS
10.0000 mg | ORAL_TABLET | Freq: Every day | ORAL | Status: DC
  Administered 2019-03-18 – 2019-03-20 (×3): 10 mg via ORAL
  Filled 2019-03-18 (×4): qty 1

## 2019-03-18 MED ORDER — MORPHINE SULFATE ER 30 MG PO TBCR
60.0000 mg | EXTENDED_RELEASE_TABLET | Freq: Two times a day (BID) | ORAL | Status: DC
  Administered 2019-03-18 – 2019-03-20 (×4): 60 mg via ORAL
  Filled 2019-03-18 (×3): qty 2
  Filled 2019-03-18: qty 4

## 2019-03-18 MED ORDER — VALPROATE SODIUM 500 MG/5ML IV SOLN
500.0000 mg | Freq: Once | INTRAVENOUS | Status: AC
  Administered 2019-03-18: 22:00:00 500 mg via INTRAVENOUS
  Filled 2019-03-18: qty 5

## 2019-03-18 MED ORDER — PREGABALIN 50 MG PO CAPS
50.0000 mg | ORAL_CAPSULE | Freq: Once | ORAL | Status: AC
  Administered 2019-03-18: 50 mg via ORAL
  Filled 2019-03-18: qty 1

## 2019-03-18 MED ORDER — TIOTROPIUM BROMIDE MONOHYDRATE 18 MCG IN CAPS
18.0000 ug | ORAL_CAPSULE | Freq: Every day | RESPIRATORY_TRACT | Status: DC
  Administered 2019-03-18 – 2019-03-20 (×3): 18 ug via RESPIRATORY_TRACT
  Filled 2019-03-18: qty 5

## 2019-03-18 MED ORDER — GABAPENTIN 600 MG PO TABS
600.0000 mg | ORAL_TABLET | Freq: Three times a day (TID) | ORAL | Status: DC
  Administered 2019-03-18 – 2019-03-20 (×7): 600 mg via ORAL
  Filled 2019-03-18 (×7): qty 1

## 2019-03-18 MED ORDER — MORPHINE SULFATE 15 MG PO TABS
30.0000 mg | ORAL_TABLET | Freq: Two times a day (BID) | ORAL | Status: DC | PRN
  Administered 2019-03-18: 14:00:00 30 mg via ORAL
  Filled 2019-03-18 (×2): qty 2

## 2019-03-18 MED ORDER — STROKE: EARLY STAGES OF RECOVERY BOOK: Freq: Once | Status: DC

## 2019-03-18 NOTE — Consult Note (Signed)
Reason for Consult:AMS Referring Physician: Mal Misty  CC: AMS  HPI: Rebecca Colon is an 58 y.o. female with a history of seizures on Depakote, anxiety, depression, breast cancer, compartment syndrome of left lower extremity, DVT, sciatic nerve pain, fibromyalgia, PTSD, and obstructive sleep apnea presenting after being found confused.  Patient's history is augmented by her daughter.  It appears that the patient drove her friend to the hospital early this morning.  Was later found on her steps in her nightgown.  Patient was confused and remains confused.    Per daughter patient with seizures diagnosed in March with abnormal EEG.  Has had 4 seizures this year including this event.    Past Medical History:  Diagnosis Date  . Anemia   . Anxiety   . Asthma   . Breast cancer ([place])   . Compartment syndrome of left lower extremity ([place])   . Depression   . DVT (deep venous thrombosis) ([place])   . Sciatic nerve injury   . Seizures ([place])     Past Surgical History:  Procedure Laterality Date  . CERVICAL FUSION    . FASCIOTOMY Left   . LUMBAR FUSION    . MASTECTOMY    . SPINAL CORD STIMULATOR IMPLANT    . TONSILLECTOMY      Family History  Problem Relation Age of Onset  . COPD Mother   . Heart disease Mother   . Hypertension Mother   . COPD Father   . Heart disease Father   . Hypertension Father     Social History:  reports that she quit smoking about 6 years ago. She has never used smokeless tobacco. She reports current alcohol use. She reports that she does not use drugs.  Allergies  Allergen Reactions  . Other Hives    Significant skin reaction to Vicryl suture  . Penicillins Anaphylaxis and Other (See Comments)    **Patient received ceftriaxone 1g IV in ED on 07/08/17 w/o ADR Has patient had a PCN reaction causing immediate rash, facial/tongue/throat swelling, SOB or lightheadedness with hypotension: Yes Has patient had a PCN reaction causing severe rash involving mucus membranes  or skin necrosis: Unknown Has patient had a PCN reaction that required hospitalization: Yes Has patient had a PCN reaction occurring within the last 10 years: No If all above answers are "NO", then may proceed with Cephalosporin use.  . Tape Rash    Medications:  I have reviewed the patient's current medications. Prior to Admission:  Prior to Admission medications   Medication Sig Start Date End Date Taking? Authorizing Provider  albuterol (PROVENTIL HFA;VENTOLIN HFA) 108 (90 Base) MCG/ACT inhaler Inhale 2 puffs into the lungs every 6 (six) hours as needed for wheezing or shortness of breath.   Yes [provider]  baclofen (LIORESAL) 20 MG tablet Take 20 mg by mouth every 8 (eight) hours as needed for muscle spasms.    Yes [provider]  divalproex (DEPAKOTE) 500 MG DR tablet Take 1 tablet (500 mg total) by mouth every 12 (twelve) hours. 06/14/18  Yes Ojie, Jude, MD  DULoxetine (CYMBALTA) 60 MG capsule Take 60 mg by mouth at bedtime.   Yes [provider]  escitalopram (LEXAPRO) 20 MG tablet Take 20 mg by mouth daily.   Yes [provider]  gabapentin (NEURONTIN) 600 MG tablet Take 600 mg by mouth 3 (three) times daily. 08/09/18  Yes [provider]  hydrOXYzine (VISTARIL) 25 MG capsule Take 25 mg by mouth 3 (three) times daily  as needed for anxiety. 08/10/18  Yes [provider]  montelukast (SINGULAIR) 10 MG tablet Take 10 mg by mouth daily.   Yes [provider]  morphine (MS CONTIN) 60 MG 12 hr tablet Take 60 mg by mouth every 12 (twelve) hours. 03/05/19  Yes [provider]  morphine (MSIR) 30 MG tablet Take 30 mg by mouth 2 (two) times daily as needed for pain. 02/09/19  Yes [provider]  MOVANTIK 25 MG TABS tablet Take 25 mg by mouth daily as needed (constipation).  10/19/17  Yes [provider]  pregabalin (LYRICA) 200 MG capsule Take 200 mg by mouth 3 (three) times daily.   Yes [provider]  tiotropium (SPIRIVA) 18 MCG inhalation capsule Place 18 mcg into inhaler and inhale daily.   Yes [provider]  traZODone (DESYREL) 50 MG tablet Take 100 mg by mouth at bedtime. 08/10/18  Yes [provider]     Scheduled: .  stroke: mapping our early stages of recovery book   Does not apply Once  . aspirin  300 mg Rectal Daily   Or  . aspirin EC  325 mg Oral Daily  . atorvastatin  10 mg Oral q1800  . divalproex  500 mg Oral Q12H  . DULoxetine  60 mg Oral QHS  . enoxaparin (LOVENOX) injection  40 mg Subcutaneous Q24H  . gabapentin  600 mg Oral TID  . montelukast  10 mg Oral Daily  . morphine  60 mg Oral Q12H  . pregabalin  200 mg Oral TID  . tiotropium  18 mcg Inhalation Daily  . traZODone  100 mg Oral QHS    ROS: History obtained from the patient  General ROS: negative for - chills, fatigue, fever, night sweats, weight gain or weight loss Psychological ROS: negative for - behavioral disorder, hallucinations, memory difficulties, mood swings or suicidal ideation Ophthalmic ROS: negative for - blurry vision, double vision, eye pain or loss of vision ENT ROS: negative for - epistaxis, nasal discharge, oral lesions, sore throat, tinnitus or vertigo Allergy and Immunology ROS: negative for - hives or itchy/watery eyes Hematological and Lymphatic ROS: negative for - bleeding problems, bruising or swollen lymph nodes Endocrine ROS: negative for - galactorrhea, hair pattern changes, polydipsia/polyuria or temperature intolerance Respiratory ROS: negative for - cough, hemoptysis, shortness of breath or wheezing Cardiovascular ROS: negative for - chest pain, dyspnea on exertion, edema or irregular heartbeat Gastrointestinal ROS: negative for - abdominal pain, diarrhea, hematemesis, nausea/vomiting or stool incontinence Genito-Urinary ROS: negative for - dysuria, hematuria, incontinence or urinary frequency/urgency Musculoskeletal ROS: foot  pain Neurological ROS: as noted in HPI Dermatological ROS: blister on left foot  Physical Examination: Blood pressure (!) 126/56, pulse 66, temperature 98.5 F (36.9 C), temperature source Oral, resp. rate 20, height 5\' 7"  (1.702 m), weight 56.2 kg, SpO2 97 %.  HEENT-  Normocephalic, no lesions, without obvious abnormality.  Normal external eye and conjunctiva.  Normal TM's bilaterally.  Normal auditory canals and external ears. Normal external nose, mucus membranes and septum.  Normal pharynx. Cardiovascular- S1, S2 normal, pulses palpable throughout   Lungs- chest clear, no wheezing, rales, normal symmetric air entry Abdomen- soft, non-tender; bowel sounds normal; no masses,  no organomegaly Extremities- LLE edema Lymph-no adenopathy palpable Musculoskeletal-no joint tenderness, deformity or swelling Skin-left sole blister, right shin injury  Neurological Examination   Mental Status: Lethargic.  Slightly agitated.  Speech fluent without evidence of aphasia.  Able to follow 3 step commands without  difficulty. Cranial Nerves: II: Discs flat bilaterally; Visual fields grossly normal, pupils equal, round, reactive to light and accommodation III,IV, VI: ptosis not present, extra-ocular motions intact bilaterally V,VII: smile symmetric, facial light touch sensation normal bilaterally VIII: hearing normal bilaterally IX,X: gag reflex present XI: bilateral shoulder shrug XII: midline tongue extension Motor: Right : Upper extremity   5/5    Left:     Upper extremity   5/5  Lower extremity   5/5     Lower extremity   5/5 Tone and bulk:normal tone throughout; no atrophy noted Sensory: Pinprick and light touch intact throughout, bilaterally Deep Tendon Reflexes: Symmetric throughout Plantars: Right: downgoing   Left: downgoing Cerebellar: Normal finger-to-nose and normal heel-to-shin testing bilaterally Gait: not tested due to safety concerns    Laboratory Studies:   Basic Metabolic  Panel: Recent Labs  Lab 03/17/19 2114  NA 139  K 3.4*  CL 101  CO2 25  GLUCOSE 122*  BUN 17  CREATININE 0.62  CALCIUM 9.4    Liver Function Tests: Recent Labs  Lab 03/17/19 2114  AST 28  ALT 16  ALKPHOS 51  BILITOT 1.3*  PROT 7.5  ALBUMIN 4.1   No results for input(s): LIPASE, AMYLASE in the last 168 hours. No results for input(s): AMMONIA in the last 168 hours.  CBC: Recent Labs  Lab 03/17/19 2114 03/18/19 0042  WBC 8.9 10.5  HGB 15.0 14.2  HCT 42.8 39.7  MCV 90.7 90.0  PLT 204 184    Cardiac Enzymes: Recent Labs  Lab 03/18/19 0954  CKTOTAL 453*    BNP: Invalid input(s): POCBNP  CBG: No results for input(s): GLUCAP in the last 168 hours.  Microbiology: Results for orders placed or performed during the hospital encounter of 03/17/19  SARS CORONAVIRUS 2 (TAT 6-24 HRS) Nasopharyngeal Nasopharyngeal Swab     Status: None   Collection Time: 03/18/19 12:42 AM   Specimen: Nasopharyngeal Swab  Result Value Ref Range Status   SARS Coronavirus 2 NEGATIVE NEGATIVE Final    Comment: (NOTE) SARS-CoV-2 target nucleic acids are NOT DETECTED. The SARS-CoV-2 RNA is generally detectable in upper and lower respiratory specimens during the acute phase of infection. Negative results do not preclude SARS-CoV-2 infection, do not rule out co-infections with other pathogens, and should not be used as the sole basis for treatment or other patient management decisions. Negative results must be combined with clinical observations, patient history, and epidemiological information. The expected result is Negative. Fact Sheet for Patients: SugarRoll.be Fact Sheet for Healthcare Providers: https://www.woods-mathews.com/ This test is not yet approved or cleared by the [place] FDA and  has been authorized for detection and/or diagnosis of SARS-CoV-2 by FDA under an Emergency Use Authorization (EUA). This EUA will remain  in  effect (meaning this test can be used) for the duration of the COVID-19 declaration under Section 56 4(b)(1) of the Act, 21 U.S.C. section 360bbb-3(b)(1), unless the authorization is terminated or revoked sooner. Performed at [facility], [place] [address], [place], [place] [address]     Coagulation Studies: No results for input(s): LABPROT, INR in the last 72 hours.  Urinalysis:  Recent Labs  Lab 03/17/19 2214  COLORURINE YELLOW*  LABSPEC 1.026  PHURINE 5.0  GLUCOSEU NEGATIVE  HGBUR NEGATIVE  BILIRUBINUR NEGATIVE  KETONESUR 5*  PROTEINUR 30*  NITRITE NEGATIVE  LEUKOCYTESUR NEGATIVE    Lipid Panel:     Component Value Date/Time   CHOL 177 03/18/2019 0954   TRIG 101 03/18/2019 0954  HDL 45 03/18/2019 0954   CHOLHDL 3.9 03/18/2019 0954   VLDL 20 03/18/2019 0954   LDLCALC 112 (H) 03/18/2019 0954    HgbA1C:  Lab Results  Component Value Date   HGBA1C 5.5 03/18/2019    Urine Drug Screen:      Component Value Date/Time   LABOPIA POSITIVE (A) 08/22/2018 1201   COCAINSCRNUR NONE DETECTED 08/22/2018 1201   LABBENZ NONE DETECTED 08/22/2018 1201   AMPHETMU NONE DETECTED 08/22/2018 1201   THCU NONE DETECTED 08/22/2018 1201   LABBARB NONE DETECTED 08/22/2018 1201    Alcohol Level: No results for input(s): ETH in the last 168 hours.  Other results: EKG: normal sinus rhythm at 69 bpm.  Imaging: CT Head Wo Contrast  Result Date: 03/17/2019 CLINICAL DATA:  Altered mental status, unclear history of seizures EXAM: CT HEAD WITHOUT CONTRAST TECHNIQUE: Contiguous axial images were obtained from the base of the skull through the vertex without intravenous contrast. COMPARISON:  CT head December 02, 2018 FINDINGS: Brain: Some cortically based hypoattenuation is seen in the left temporal lobe. No other sites concerning for acute infarct. No evidence of hemorrhage, extra-axial collection, mass effect or midline shift. Patchy areas of white matter hypoattenuation are most  compatible with chronic microvascular angiopathy. No evidence of acute infarction, hemorrhage, hydrocephalus, extra-axial collection or mass lesion/mass effect. Symmetric prominence of the ventricles, cisterns and sulci compatible with parenchymal volume loss. Patchy areas of white matter hypoattenuation are most compatible with chronic microvascular angiopathy. Vascular: No hyperdense vessel or unexpected calcification. Skull: No calvarial fracture or suspicious osseous lesion. No scalp swelling or hematoma. Sinuses/Orbits: Paranasal sinuses and mastoid air cells are predominantly clear. Included orbital structures are unremarkable. Other: None IMPRESSION: Some cortically based hypoattenuation is seen in the left temporal lobe worrisome for acute/subacute ischemia. Could consider further evaluation with MRI. Features of mild frontal predominant parenchymal volume loss and chronic microvascular angiopathy. Electronically Signed   By: Lovena Le M.D.   On: 03/17/2019 23:30   US Carotid Bilateral (at [facility] and [facility] only)  Result Date: 03/18/2019 CLINICAL DATA:  TIA.  Syncopal episode.  Former smoker. EXAM: BILATERAL CAROTID DUPLEX ULTRASOUND TECHNIQUE: Pearline Cables scale imaging, color Doppler and duplex ultrasound were performed of bilateral carotid and vertebral arteries in the neck. COMPARISON:  None. FINDINGS: Criteria: Quantification of carotid stenosis is based on velocity parameters that correlate the residual internal carotid diameter with NASCET-based stenosis levels, using the diameter of the distal internal carotid lumen as the denominator for stenosis measurement. The following velocity measurements were obtained: RIGHT ICA: 47/9 cm/sec CCA: 0000000 cm/sec SYSTOLIC ICA/CCA RATIO:  0.7 ECA: 73 cm/sec LEFT ICA: 38/8 cm/sec CCA: 123456 cm/sec SYSTOLIC ICA/CCA RATIO:  0.4 ECA: 62 cm/sec RIGHT CAROTID ARTERY: There is no grayscale evidence of significant intimal thickening or atherosclerotic plaque affecting the  interrogated portions of the right carotid system. There are no elevated peak systolic velocities within the interrogated course of the right internal carotid artery to suggest a hemodynamically significant stenosis. RIGHT VERTEBRAL ARTERY:  Antegrade flow LEFT CAROTID ARTERY: There is no grayscale evidence of significant intimal thickening or atherosclerotic plaque affecting the interrogated portions of the left carotid system. There are no elevated peak systolic velocities within the interrogated course of the left internal carotid artery to suggest a hemodynamically significant stenosis. LEFT VERTEBRAL ARTERY:  Antegrade flow IMPRESSION: Normal carotid Doppler ultrasound. Electronically Signed   By: Sandi Mariscal M.D.   On: 03/18/2019 09:46   DG Knee Complete 4 Views Left  Result  Date: 03/17/2019 CLINICAL DATA:  Bilateral knee pain status post multiple falls. EXAM: RIGHT KNEE - COMPLETE 4+ VIEW; LEFT KNEE - COMPLETE 4+ VIEW COMPARISON:  None. FINDINGS: There is no acute displaced fracture or dislocation. No significant joint effusion. There are tricompartmental degenerative changes, greatest within the medial compartment. There is no significant prepatellar soft tissue swelling. IMPRESSION: 1. No acute bony abnormality. 2. Tricompartmental degenerative changes, greatest in the medial compartment. Electronically Signed   By: Constance Holster M.D.   On: 03/17/2019 23:43   DG Knee Complete 4 Views Right  Result Date: 03/17/2019 CLINICAL DATA:  Bilateral knee pain status post multiple falls. EXAM: RIGHT KNEE - COMPLETE 4+ VIEW; LEFT KNEE - COMPLETE 4+ VIEW COMPARISON:  None. FINDINGS: There is no acute displaced fracture or dislocation. No significant joint effusion. There are tricompartmental degenerative changes, greatest within the medial compartment. There is no significant prepatellar soft tissue swelling. IMPRESSION: 1. No acute bony abnormality. 2. Tricompartmental degenerative changes, greatest in  the medial compartment. Electronically Signed   By: Constance Holster M.D.   On: 03/17/2019 23:43    Assessment/Plan: 58 y.o. female with a history of seizures (on Depakote, Lyrica and Neurontin), anxiety, depression, breast cancer, compartment syndrome of left lower extremity, DVT, sciatic nerve pain, fibromyalgia, PTSD, and obstructive sleep apnea presenting after being found confused.  This is likely representative of post-ictal phenomena.  Head CT with some concern for infarct due to hypodensity in the left temporal lobe.  Neurological examination is nonfocal.  Depakote level of 48.  Unclear compliance.    Recommendations: 1. EEG 2. Depakote level in AM.  Will make adjustments in dosing based on results.   3. Seizure precautions 4. If able, patient to have MRI of the brain without contrast.  Would otherwise repeat head CT in 24 to 48 hours.    5. ASA 81mg  daily 6. Neuro checks 7. Telemetry     Alexis Goodell, MD Neurology [phone] 03/18/2019, 12:55 PM

## 2019-03-18 NOTE — ED Notes (Signed)
Admitting at bedside. Will move pt to CPOD when MD finishes talking to pt and daughter.

## 2019-03-18 NOTE — Progress Notes (Addendum)
Patient is confused and does not remember much.  Her daughter who was at the bedside also said she was unable to piece together events transpired but patient does have history of seizures.  Ordered EEG.  Consulted neurologist.  Patient has a one-to-one sitter at the bedside.  Continue aspirin, statin, antiepileptics and seizure precautions.  Home medications have been ordered.  Plan of care was discussed with patient and her daughter, Mendel Ryder, at the bedside.

## 2019-03-18 NOTE — ED Notes (Signed)
Light green recollect sent to lab at this time.

## 2019-03-18 NOTE — Procedures (Signed)
ELECTROENCEPHALOGRAM REPORT   Patient: Rebecca Colon       Room #: 123A-AA EEG No. ID: [id number] Age: 58 y.o.        Sex: female Referring Physician: Mal Misty Report Date:  03/18/2019        Interpreting Physician: Alexis Goodell  History: [name] is an 58 y.o. female with altered mental status  Medications:  Lipitor, Depakote, Cymbalta, Neurontin, MS Contin, Lyrica, Spiriva, Trazadone  Conditions of Recording:  This is a 21 channel routine scalp EEG performed with bipolar and monopolar montages arranged in accordance to the international 10/20 system of electrode placement. One channel was dedicated to EKG recording.  The patient is in the altered state.  Description:  The background activity is slow and poorly organized.  It consists of a low to moderate voltage mixture of theta and delta activity with a maximum posterior background rhythm of 6Hz .  There are intermittent periods when the background slows even further and bifrontal sharp waves are noted. Hyperventilation was not performed.   Intermittent photic stimulation was performed but failed to illicit any change in the tracing.   IMPRESSION: This is an abnormal electroencephalogram secondary to general background slowing with intermittent periods of bifrontal sharp waves consistent with the patient's history of seizures.  No electrographic seizures are noted.    Alexis Goodell, MD Neurology [phone] 03/18/2019, 5:51 PM

## 2019-03-18 NOTE — ED Notes (Signed)
Pt helped to bathroom with 2 assist.

## 2019-03-18 NOTE — ED Notes (Signed)
ED TO INPATIENT HANDOFF REPORT  ED Nurse Name and Phone #:    S Name/Age/Gender Tona Sensing 58 y.o. female Room/Bed: ED31A/ED31A  Code Status   Code Status: Full Code  Home/SNF/Other Home Patient oriented to: self, place, time and situation Is this baseline? Yes   Triage Complete: Triage complete  Chief Complaint Altered mental status [R41.82] Stroke [facility] [place]) [I63.9]  Triage Note Pt arrived via [facility] with reports of AMS, per EMS, initial call was for seizure activity. However on arrival, pt was found to be shivering.  Pt is alert on arrival, no signs of being post-ictal.  Per EMS, pt was found by neighbor in car at 5am.  Another person reported to EMS that patient may have been in car since yesterday morning.  Pt was here in the ED visiting someone on 03/16/19 visiting girlfriend in the ED.  Pt arrived with robe on and no other clothes besides slippers.  When EMS asked patient her name she gave her daughter's name, when asked the date she said her birthday.  Pt does not remember events that occurred after coming home with friend from hospital.    Allergies Allergies  Allergen Reactions  . Other Hives    Significant skin reaction to Vicryl suture  . Penicillins Anaphylaxis and Other (See Comments)    **Patient received ceftriaxone 1g IV in ED on 07/08/17 w/o ADR Has patient had a PCN reaction causing immediate rash, facial/tongue/throat swelling, SOB or lightheadedness with hypotension: Yes Has patient had a PCN reaction causing severe rash involving mucus membranes or skin necrosis: Unknown Has patient had a PCN reaction that required hospitalization: Yes Has patient had a PCN reaction occurring within the last 10 years: No If all above answers are "NO", then may proceed with Cephalosporin use.  . Tape Rash    Level of Care/Admitting Diagnosis ED Disposition    ED Disposition Condition [facility] Area: [place] [100120]   Level of Care: Med-Surg [16]  Covid Evaluation: Asymptomatic Screening Protocol (No Symptoms)  [facility]: [id number]  Admitting Physician: Rayburn Go  Attending Physician: Rayburn Go  Estimated length of stay: past midnight tomorrow  Certification:: I certify this patient will need inpatient services for at least 2 midnights       B Medical/Surgery History Past Medical History:  Diagnosis Date  . Anemia   . Anxiety   . Asthma   . Breast cancer ([place])   . 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 Depression   . DVT (deep venous thrombosis) ([place])   . Sciatic nerve injury   . Seizures ([place])    Past Surgical History:  Procedure Laterality Date  . CERVICAL FUSION    . FASCIOTOMY Left   . LUMBAR FUSION    . MASTECTOMY    . SPINAL CORD STIMULATOR IMPLANT    . TONSILLECTOMY       A IV Location/Drains/Wounds Patient Lines/Drains/Airways Status   Active Line/Drains/Airways    Name:   Placement date:   Placement time:   Site:   Days:   Peripheral IV 03/17/19 Left Antecubital   03/17/19    2112    Antecubital   1          Intake/Output Last 24 hours No intake or output data in the 24 hours ending 03/18/19 1254  Labs/Imaging Results for orders placed or performed during the hospital encounter of 03/17/19 (from the past 48 hour(s))  Comprehensive metabolic panel     Status: Abnormal   Collection Time: 03/17/19  9:14 PM  Result Value Ref Range   Sodium 139 135 - 145 mmol/L   Potassium 3.4 (L) 3.5 - 5.1 mmol/L   Chloride 101 98 - 111 mmol/L   CO2 25 22 - 32 mmol/L   Glucose, Bld 122 (H) 70 - 99 mg/dL   BUN 17 6 - 20 mg/dL   Creatinine, Ser 0.62 0.44 - 1.00 mg/dL   Calcium 9.4 8.9 - 10.3 mg/dL   Total Protein 7.5 6.5 - 8.1 g/dL   Albumin 4.1 3.5 - 5.0 g/dL   AST 28 15 - 41 U/L   ALT 16 0 - 44 U/L   Alkaline Phosphatase 51 38 - 126 U/L   Total Bilirubin 1.3 (H) 0.3 - 1.2 mg/dL   GFR calc non Af Amer >60 >60 mL/min    GFR calc Af Amer >60 >60 mL/min   Anion gap 13 5 - 15    Comment: Performed at [facility], [address] [place], [place] [address]  CBC     Status: None   Collection Time: 03/17/19  9:14 PM  Result Value Ref Range   WBC 8.9 4.0 - 10.5 K/uL   RBC 4.72 3.87 - 5.11 MIL/uL   Hemoglobin 15.0 12.0 - 15.0 g/dL   HCT 42.8 36.0 - 46.0 %   MCV 90.7 80.0 - 100.0 fL   MCH 31.8 26.0 - 34.0 pg   MCHC 35.0 30.0 - 36.0 g/dL   RDW 11.5 11.5 - 15.5 %   Platelets 204 150 - 400 K/uL   nRBC 0.0 0.0 - 0.2 %    Comment: Performed at [facility], [address] [place], [place] [address]  Valproic acid level     Status: Abnormal   Collection Time: 03/17/19  9:14 PM  Result Value Ref Range   Valproic Acid Lvl 48 (L) 50.0 - 100.0 ug/mL    Comment: Performed at [facility], [address] [place], [place] [address]  Urinalysis, Routine w reflex microscopic     Status: Abnormal   Collection Time: 03/17/19 10:14 PM  Result Value Ref Range   Color, Urine YELLOW (A) YELLOW   APPearance HAZY (A) CLEAR   Specific Gravity, Urine 1.026 1.005 - 1.030   pH 5.0 5.0 - 8.0   Glucose, UA NEGATIVE NEGATIVE mg/dL   Hgb urine dipstick NEGATIVE NEGATIVE   Bilirubin Urine NEGATIVE NEGATIVE   Ketones, ur 5 (A) NEGATIVE mg/dL   Protein, ur 30 (A) NEGATIVE mg/dL   Nitrite NEGATIVE NEGATIVE   Leukocytes,Ua NEGATIVE NEGATIVE   RBC / HPF 0-5 0 - 5 RBC/hpf   WBC, UA 0-5 0 - 5 WBC/hpf   Bacteria, UA RARE (A) NONE SEEN   Squamous Epithelial / LPF 0-5 0 - 5   Mucus PRESENT     Comment: Performed at [facility] [address] [place], [place] [address]  SARS CORONAVIRUS 2 (TAT 6-24 HRS) Nasopharyngeal Nasopharyngeal Swab     Status: None   Collection Time: 03/18/19 12:42 AM   Specimen: Nasopharyngeal Swab  Result Value Ref Range   SARS Coronavirus 2 NEGATIVE NEGATIVE    Comment: (NOTE) SARS-CoV-2 target nucleic acids are NOT DETECTED. The SARS-CoV-2 RNA is generally  detectable in upper and lower respiratory specimens during the acute phase of infection. Negative results do not preclude SARS-CoV-2 infection, do not rule out co-infections with other pathogens, and should not be  used as the sole basis for treatment or other patient management decisions. Negative results must be combined with clinical observations, patient history, and epidemiological information. The expected result is Negative. Fact Sheet for Patients: SugarRoll.be Fact Sheet for Healthcare Providers: https://www.woods-mathews.com/ This test is not yet approved or cleared by the [place] FDA and  has been authorized for detection and/or diagnosis of SARS-CoV-2 by FDA under an Emergency Use Authorization (EUA). This EUA will remain  in effect (meaning this test can be used) for the duration of the COVID-19 declaration under Section 56 4(b)(1) of the Act, 21 U.S.C. section 360bbb-3(b)(1), unless the authorization is terminated or revoked sooner. Performed at [facility], [address], [place], [place] [address]   CBC     Status: Abnormal   Collection Time: 03/18/19 12:42 AM  Result Value Ref Range   WBC 10.5 4.0 - 10.5 K/uL   RBC 4.41 3.87 - 5.11 MIL/uL   Hemoglobin 14.2 12.0 - 15.0 g/dL   HCT 39.7 36.0 - 46.0 %   MCV 90.0 80.0 - 100.0 fL   MCH 32.2 26.0 - 34.0 pg   MCHC 35.8 30.0 - 36.0 g/dL   RDW 11.4 (L) 11.5 - 15.5 %   Platelets 184 150 - 400 K/uL   nRBC 0.0 0.0 - 0.2 %    Comment: Performed at [facility], [address] [place], [place] [address]  Hemoglobin A1c     Status: None   Collection Time: 03/18/19  7:57 AM  Result Value Ref Range   Hgb A1c MFr Bld 5.5 4.8 - 5.6 %    Comment: (NOTE) Pre diabetes:          5.7%-6.4% Diabetes:              >6.4% Glycemic control for   <7.0% adults with diabetes    Mean Plasma Glucose 111.15 mg/dL    Comment: Performed at [facility] [address] [place], [place] [address]  CK     Status: Abnormal   Collection Time: 03/18/19  9:54 AM  Result Value Ref Range   Total CK 453 (H) 38 - 234 U/L    Comment: Performed at [facility] [address] [place], [place] [address]  Lipid panel     Status: Abnormal   Collection Time: 03/18/19  9:54 AM  Result Value Ref Range   Cholesterol 177 0 - 200 mg/dL   Triglycerides 101 <150 mg/dL   HDL 45 >40 mg/dL   Total CHOL/HDL Ratio 3.9 RATIO   VLDL 20 0 - 40 mg/dL   LDL Cholesterol 112 (H) 0 - 99 mg/dL    Comment:        Total Cholesterol/HDL:CHD Risk Coronary Heart Disease Risk Table                     Men   Women  1/2 Average Risk   3.4   3.3  Average Risk       5.0   4.4  2 X Average Risk   9.6   7.1  3 X Average Risk  23.4   11.0        Use the calculated Patient Ratio above and the CHD Risk Table to determine the patient's CHD Risk.        ATP III CLASSIFICATION (LDL):  <100     mg/dL   Optimal  100-129  mg/dL   Near or Above  Optimal  130-159  mg/dL   Borderline  160-189  mg/dL   High  >190     mg/dL   Very High Performed at [facility], [place], [place] [address]    CT Head Wo Contrast  Result Date: 03/17/2019 CLINICAL DATA:  Altered mental status, unclear history of seizures EXAM: CT HEAD WITHOUT CONTRAST TECHNIQUE: Contiguous axial images were obtained from the base of the skull through the vertex without intravenous contrast. COMPARISON:  CT head December 02, 2018 FINDINGS: Brain: Some cortically based hypoattenuation is seen in the left temporal lobe. No other sites concerning for acute infarct. No evidence of hemorrhage, extra-axial collection, mass effect or midline shift. Patchy areas of white matter hypoattenuation are most compatible with chronic microvascular angiopathy. No evidence of acute infarction, hemorrhage, hydrocephalus, extra-axial collection or mass lesion/mass effect. Symmetric prominence of the  ventricles, cisterns and sulci compatible with parenchymal volume loss. Patchy areas of white matter hypoattenuation are most compatible with chronic microvascular angiopathy. Vascular: No hyperdense vessel or unexpected calcification. Skull: No calvarial fracture or suspicious osseous lesion. No scalp swelling or hematoma. Sinuses/Orbits: Paranasal sinuses and mastoid air cells are predominantly clear. Included orbital structures are unremarkable. Other: None IMPRESSION: Some cortically based hypoattenuation is seen in the left temporal lobe worrisome for acute/subacute ischemia. Could consider further evaluation with MRI. Features of mild frontal predominant parenchymal volume loss and chronic microvascular angiopathy. Electronically Signed   By: Lovena Le M.D.   On: 03/17/2019 23:30   US Carotid Bilateral (at [facility] and [facility] only)  Result Date: 03/18/2019 CLINICAL DATA:  TIA.  Syncopal episode.  Former smoker. EXAM: BILATERAL CAROTID DUPLEX ULTRASOUND TECHNIQUE: Pearline Cables scale imaging, color Doppler and duplex ultrasound were performed of bilateral carotid and vertebral arteries in the neck. COMPARISON:  None. FINDINGS: Criteria: Quantification of carotid stenosis is based on velocity parameters that correlate the residual internal carotid diameter with NASCET-based stenosis levels, using the diameter of the distal internal carotid lumen as the denominator for stenosis measurement. The following velocity measurements were obtained: RIGHT ICA: 47/9 cm/sec CCA: 0000000 cm/sec SYSTOLIC ICA/CCA RATIO:  0.7 ECA: 73 cm/sec LEFT ICA: 38/8 cm/sec CCA: 123456 cm/sec SYSTOLIC ICA/CCA RATIO:  0.4 ECA: 62 cm/sec RIGHT CAROTID ARTERY: There is no grayscale evidence of significant intimal thickening or atherosclerotic plaque affecting the interrogated portions of the right carotid system. There are no elevated peak systolic velocities within the interrogated course of the right internal carotid artery to suggest a hemodynamically  significant stenosis. RIGHT VERTEBRAL ARTERY:  Antegrade flow LEFT CAROTID ARTERY: There is no grayscale evidence of significant intimal thickening or atherosclerotic plaque affecting the interrogated portions of the left carotid system. There are no elevated peak systolic velocities within the interrogated course of the left internal carotid artery to suggest a hemodynamically significant stenosis. LEFT VERTEBRAL ARTERY:  Antegrade flow IMPRESSION: Normal carotid Doppler ultrasound. Electronically Signed   By: Sandi Mariscal M.D.   On: 03/18/2019 09:46   DG Knee Complete 4 Views Left  Result Date: 03/17/2019 CLINICAL DATA:  Bilateral knee pain status post multiple falls. EXAM: RIGHT KNEE - COMPLETE 4+ VIEW; LEFT KNEE - COMPLETE 4+ VIEW COMPARISON:  None. FINDINGS: There is no acute displaced fracture or dislocation. No significant joint effusion. There are tricompartmental degenerative changes, greatest within the medial compartment. There is no significant prepatellar soft tissue swelling. IMPRESSION: 1. No acute bony abnormality. 2. Tricompartmental degenerative changes, greatest in the medial compartment. Electronically Signed   By: Harrell Gave  Green M.D.   On: 03/17/2019 23:43   DG Knee Complete 4 Views Right  Result Date: 03/17/2019 CLINICAL DATA:  Bilateral knee pain status post multiple falls. EXAM: RIGHT KNEE - COMPLETE 4+ VIEW; LEFT KNEE - COMPLETE 4+ VIEW COMPARISON:  None. FINDINGS: There is no acute displaced fracture or dislocation. No significant joint effusion. There are tricompartmental degenerative changes, greatest within the medial compartment. There is no significant prepatellar soft tissue swelling. IMPRESSION: 1. No acute bony abnormality. 2. Tricompartmental degenerative changes, greatest in the medial compartment. Electronically Signed   By: Constance Holster M.D.   On: 03/17/2019 23:43    Pending Labs Unresulted Labs (From admission, onward)    Start     Ordered   03/25/19  0500  Creatinine, serum  (enoxaparin (LOVENOX)    CrCl >/= 30 ml/min)  Weekly,   STAT    Comments: while on enoxaparin therapy    03/18/19 0016          Vitals/Pain Today's Vitals   03/18/19 0711 03/18/19 0757 03/18/19 1136 03/18/19 1141  BP: (!) (P) 113/95  (!) 126/56   Pulse:   66   Resp:   20   Temp:      TempSrc:   Oral   SpO2:   97% 97%  Weight:      Height:      PainSc:  0-No pain 0-No pain     Isolation Precautions No active isolations  Medications Medications  divalproex (DEPAKOTE) DR tablet 500 mg (500 mg Oral Given 03/18/19 0957)  0.9 %  sodium chloride infusion (75 mL/hr Intravenous New Bag/Given 03/18/19 0757)  enoxaparin (LOVENOX) injection 40 mg (40 mg Subcutaneous Given 03/18/19 0100)   stroke: mapping our early stages of recovery book (has no administration in time range)  aspirin suppository 300 mg ( Rectal See Alternative 03/18/19 0956)    Or  aspirin EC tablet 325 mg (325 mg Oral Given 03/18/19 0956)  atorvastatin (LIPITOR) tablet 10 mg (has no administration in time range)  morphine (MS CONTIN) 12 hr tablet 60 mg (has no administration in time range)  morphine (MSIR) tablet 30 mg (has no administration in time range)  DULoxetine (CYMBALTA) DR capsule 60 mg (has no administration in time range)  hydrOXYzine (ATARAX/VISTARIL) tablet 25 mg (has no administration in time range)  traZODone (DESYREL) tablet 100 mg (has no administration in time range)  baclofen (LIORESAL) tablet 20 mg (has no administration in time range)  gabapentin (NEURONTIN) tablet 600 mg (has no administration in time range)  pregabalin (LYRICA) capsule 200 mg (has no administration in time range)  albuterol (PROVENTIL) (2.5 MG/3ML) 0.083% nebulizer solution 3 mL (has no administration in time range)  montelukast (SINGULAIR) tablet 10 mg (has no administration in time range)  tiotropium (SPIRIVA) inhalation capsule ([facility] use ONLY) 18 mcg (has no administration in time range)   acetaminophen (TYLENOL) tablet 1,000 mg (1,000 mg Oral Given 03/17/19 2344)    Mobility walks High fall risk   Focused Assessments Neuro Assessment Handoff:  Swallow screen pass? Yes    NIH Stroke Scale ( + Modified Stroke Scale Criteria)  Interval: 2 hrs post IV TPA Level of Consciousness (1a.)   : Alert, keenly responsive LOC Questions (1b. )   +: Answers both questions correctly LOC Commands (1c. )   + : Performs both tasks correctly Best Gaze (2. )  +: Normal Visual (3. )  +: No visual loss Facial Palsy (4. )    : Normal symmetrical  movements Motor Arm, Left (5a. )   +: No drift Motor Arm, Right (5b. )   +: No drift Motor Leg, Left (6a. )   +: No drift Motor Leg, Right (6b. )   +: No drift Limb Ataxia (7. ): Absent Sensory (8. )   +: Normal, no sensory loss Best Language (9. )   +: No aphasia Dysarthria (10. ): Normal Extinction/Inattention (11.)   +: No Abnormality Modified SS Total  +: 0 Complete NIHSS TOTAL: 0     Neuro Assessment: Within Defined Limits Neuro Checks:   2 hrs post IV TPA (03/18/19 0043)  Last Documented NIHSS Modified Score: 0 (03/18/19 1036) Has TPA been given? No If patient is a Neuro Trauma and patient is going to OR before floor call report to [facility] nurse: [phone] or [phone]  ,    R Recommendations: See Admitting Provider Note  Report given to:   Additional Notes:

## 2019-03-18 NOTE — ED Notes (Signed)
Pt taken to [place] suite at this time.

## 2019-03-18 NOTE — ED Notes (Signed)
Pt called out reporting the pain in her feet has increased and she is requesting something to ease the pain in her feet. Pt RN made aware.

## 2019-03-18 NOTE — ED Notes (Signed)
AWAITING EEG  SITTER AT BEDSIDE .

## 2019-03-18 NOTE — Progress Notes (Signed)
OT Cancellation Note  Patient Details Name: Rebecca Colon MRN: [id number] DOB: 03-Apr-1961   Cancelled Treatment:    Reason Eval/Treat Not Completed: Patient at procedure or test/ unavailable  OT order received and chart reviewed. Pt in process of transferring rooms when OT presents. Will f/u for occupational therapy evaluation as able.  Gerrianne Scale, [place], OTR/L ascom [phone] 03/18/19, 11:23 AM

## 2019-03-18 NOTE — H&P (Signed)
History and Physical    Rebecca Colon [id number] DOB: [birth year] DOA: 03/17/2019  PCP: System, Pcp Not In   Chief Complaint: Altered mental status  HPI: Rebecca Colon is a 58 y.o. female with History of seizure disorder, chronic pain on chronic narcotic therapy and with spinal stimulator, history of COPD, depression and hypothyroidism who was brought into the emergency room after she was found sitting in her car.  She was apparently sitting there for over 24 hours per report given to EMS.  She arrived in the emergency room she was reportedly confused and disoriented.  She complained of knee pain and pains at the bottom of her heels she was noted to have friction blisters.  States she had not taken his seizure medicine in over a month.  Said she was in her usual state of health.  Said that she was just very sleepy.  Unknown whether she had a seizure.  No stigmata of seizure, no bitten tongue no urinary incontinence.  In the emergency room, vitals were within normal limits.  Valproate level was slightly decreased at 48.  Her blood work was mostly unremarkable.  Urinalysis was negative.  CT head was significant for findings of acute ischemia with recommendation for MRI.  Patient could not get MRI because of a spinal cord stimulator.  TRH consulted for admission    Review of Systems: As per HPI otherwise 10 point review of systems negative.   Past Medical History:  Diagnosis Date  . Anemia   . Anxiety   . Asthma   . Breast cancer ([place])   . 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 Depression   . DVT (deep venous thrombosis) ([place])   . Sciatic nerve injury   . Seizures ([place])     Past Surgical History:  Procedure Laterality Date  . CERVICAL FUSION    . FASCIOTOMY Left   . LUMBAR FUSION    . MASTECTOMY    . SPINAL CORD STIMULATOR IMPLANT    . TONSILLECTOMY       reports that she quit smoking about 6 years ago. She has never used smokeless tobacco. She reports current  alcohol use. She reports that she does not use drugs.  Allergies  Allergen Reactions  . Other Hives    Significant skin reaction to Vicryl suture  . Penicillins Anaphylaxis and Other (See Comments)    **Patient received ceftriaxone 1g IV in ED on 07/08/17 w/o ADR Has patient had a PCN reaction causing immediate rash, facial/tongue/throat swelling, SOB or lightheadedness with hypotension: Yes Has patient had a PCN reaction causing severe rash involving mucus membranes or skin necrosis: Unknown Has patient had a PCN reaction that required hospitalization: Yes Has patient had a PCN reaction occurring within the last 10 years: No If all above answers are "NO", then may proceed with Cephalosporin use.  . Tape Rash    Family History  Problem Relation Age of Onset  . COPD Mother   . Heart disease Mother   . Hypertension Mother   . COPD Father   . Heart disease Father   . Hypertension Father      Prior to Admission medications   Medication Sig Start Date End Date Taking? Authorizing Provider  albuterol (PROVENTIL HFA;VENTOLIN HFA) 108 (90 Base) MCG/ACT inhaler Inhale 2 puffs into the lungs every 6 (six) hours as needed for wheezing or shortness of breath.   Yes [provider]  baclofen (LIORESAL) 20 MG tablet Take 20  mg by mouth every 8 (eight) hours as needed for muscle spasms.    Yes [provider]  divalproex (DEPAKOTE) 500 MG DR tablet Take 1 tablet (500 mg total) by mouth every 12 (twelve) hours. 06/14/18  Yes Ojie, Jude, MD  DULoxetine (CYMBALTA) 60 MG capsule Take 60 mg by mouth at bedtime.   Yes [provider]  escitalopram (LEXAPRO) 20 MG tablet Take 20 mg by mouth daily.   Yes [provider]  gabapentin (NEURONTIN) 600 MG tablet Take 600 mg by mouth 3 (three) times daily. 08/09/18  Yes [provider]  hydrOXYzine (VISTARIL) 25 MG capsule Take 25 mg by mouth 3 (three) times daily as needed for anxiety. 08/10/18  Yes [provider]  montelukast (SINGULAIR) 10 MG tablet Take 10 mg by mouth daily.   Yes [provider]  morphine (MS CONTIN) 60 MG 12 hr tablet Take 60 mg by mouth every 12 (twelve) hours. 03/05/19  Yes [provider]  morphine (MSIR) 30 MG tablet Take 30 mg by mouth 2 (two) times daily as needed for pain. 02/09/19  Yes [provider]  MOVANTIK 25 MG TABS tablet Take 25 mg by mouth daily as needed (constipation).  10/19/17  Yes [provider]  pregabalin (LYRICA) 200 MG capsule Take 200 mg by mouth 3 (three) times daily.   Yes [provider]  tiotropium (SPIRIVA) 18 MCG inhalation capsule Place 18 mcg into inhaler and inhale daily.   Yes [provider]  traZODone (DESYREL) 50 MG tablet Take 100 mg by mouth at bedtime. 08/10/18  Yes [provider]    Physical Exam: Vitals:   03/17/19 2048 03/17/19 2056 03/17/19 2058  BP: (!) 143/71    Pulse: 70    Resp: 18    Temp: 98.5 F (36.9 C)    TempSrc: Oral    SpO2: 97%  99%  Weight:  56.2 kg   Height:  5\' 7"  (1.702 m)     Constitutional: NAD, calm, comfortable Vitals:   03/17/19 2048 03/17/19 2056 03/17/19 2058  BP: (!) 143/71    Pulse: 70    Resp: 18    Temp: 98.5 F (36.9 C)    TempSrc: Oral    SpO2: 97%  99%  Weight:  56.2 kg   Height:  5\' 7"  (1.702 m)    Eyes: PERRL, lids and conjunctivae normal ENMT: Mucous membranes are moist. Posterior pharynx clear of any exudate or lesions.Normal dentition.  Neck: normal, supple, no masses, no thyromegaly Respiratory: clear to auscultation bilaterally, no wheezing, no crackles. Normal respiratory effort. No accessory muscle use.  Cardiovascular: Regular rate and rhythm, no murmurs / rubs / gallops. No extremity edema. 2+ pedal pulses. No carotid bruits.  Abdomen: no tenderness, no masses palpated. No hepatosplenomegaly. Bowel sounds positive.  Musculoskeletal: no clubbing / cyanosis. No joint deformity upper and lower extremities. Good ROM,  no contractures. Normal muscle tone.  Skin: friction blisters bilateral heels  neurologic: CN 2-12 grossly intact. Sensation intact, DTR normal. Strength 5/5 in all 4.  Psychiatric: Normal judgment and insight. Alert and oriented x 3. Normal mood.   Labs on Admission: I have personally reviewed following labs and imaging studies  CBC: Recent Labs  Lab 03/17/19 2114 03/18/19 0042  WBC 8.9 10.5  HGB 15.0 14.2  HCT 42.8 39.7  MCV 90.7 90.0  PLT 204 [id number]   Basic Metabolic Panel: Recent Labs  Lab 03/17/19 2114  NA 139  K 3.4*  CL 101  CO2 25  GLUCOSE 122*  BUN 17  CREATININE 0.62  CALCIUM 9.4   GFR: Estimated Creatinine Clearance: 69.7 mL/min (by C-G formula based on SCr of 0.62 mg/dL). Liver Function Tests: Recent Labs  Lab 03/17/19 2114  AST 28  ALT 16  ALKPHOS 51  BILITOT 1.3*  PROT 7.5  ALBUMIN 4.1   No results for input(s): LIPASE, AMYLASE in the last 168 hours. No results for input(s): AMMONIA in the last 168 hours. Coagulation Profile: No results for input(s): INR, PROTIME in the last 168 hours. Cardiac Enzymes: No results for input(s): CKTOTAL, CKMB, CKMBINDEX, TROPONINI in the last 168 hours. BNP (last 3 results) No results for input(s): PROBNP in the last 8760 hours. HbA1C: No results for input(s): HGBA1C in the last 72 hours. CBG: No results for input(s): GLUCAP in the last 168 hours. Lipid Profile: No results for input(s): CHOL, HDL, LDLCALC, TRIG, CHOLHDL, LDLDIRECT in the last 72 hours. Thyroid Function Tests: No results for input(s): TSH, T4TOTAL, FREET4, T3FREE, THYROIDAB in the last 72 hours. Anemia Panel: No results for input(s): VITAMINB12, FOLATE, FERRITIN, TIBC, IRON, RETICCTPCT in the last 72 hours. Urine analysis:    Component Value Date/Time   COLORURINE YELLOW (A) 03/17/2019 2214   APPEARANCEUR HAZY (A) 03/17/2019 2214   LABSPEC 1.026 03/17/2019 2214   PHURINE 5.0 03/17/2019 2214   [place] 03/17/2019 2214   [place]  NEGATIVE 03/17/2019 2214   BILIRUBINUR NEGATIVE 03/17/2019 2214   KETONESUR 5 (A) 03/17/2019 2214   PROTEINUR 30 (A) 03/17/2019 2214   NITRITE NEGATIVE 03/17/2019 2214   LEUKOCYTESUR NEGATIVE 03/17/2019 2214    Radiological Exams on Admission: CT Head Wo Contrast  Result Date: 03/17/2019 CLINICAL DATA:  Altered mental status, unclear history of seizures EXAM: CT HEAD WITHOUT CONTRAST TECHNIQUE: Contiguous axial images were obtained from the base of the skull through the vertex without intravenous contrast. COMPARISON:  CT head December 02, 2018 FINDINGS: Brain: Some cortically based hypoattenuation is seen in the left temporal lobe. No other sites concerning for acute infarct. No evidence of hemorrhage, extra-axial collection, mass effect or midline shift. Patchy areas of white matter hypoattenuation are most compatible with chronic microvascular angiopathy. No evidence of acute infarction, hemorrhage, hydrocephalus, extra-axial collection or mass lesion/mass effect. Symmetric prominence of the ventricles, cisterns and sulci compatible with parenchymal volume loss. Patchy areas of white matter hypoattenuation are most compatible with chronic microvascular angiopathy. Vascular: No hyperdense vessel or unexpected calcification. Skull: No calvarial fracture or suspicious osseous lesion. No scalp swelling or hematoma. Sinuses/Orbits: Paranasal sinuses and mastoid air cells are predominantly clear. Included orbital structures are unremarkable. Other: None IMPRESSION: Some cortically based hypoattenuation is seen in the left temporal lobe worrisome for acute/subacute ischemia. Could consider further evaluation with MRI. Features of mild frontal predominant parenchymal volume loss and chronic microvascular angiopathy. Electronically Signed   By: Lovena Le M.D.   On: 03/17/2019 23:30   DG Knee Complete 4 Views Left  Result Date: 03/17/2019 CLINICAL DATA:  Bilateral knee pain status post multiple falls.  EXAM: RIGHT KNEE - COMPLETE 4+ VIEW; LEFT KNEE - COMPLETE 4+ VIEW COMPARISON:  None. FINDINGS: There is no acute displaced fracture or dislocation. No significant joint effusion. There are tricompartmental degenerative changes, greatest within the medial compartment. There is no significant prepatellar soft tissue swelling. IMPRESSION: 1. No acute bony abnormality. 2. Tricompartmental degenerative changes, greatest in the medial compartment. Electronically Signed   By: Constance Holster M.D.   On: 03/17/2019 23:43  DG Knee Complete 4 Views Right  Result Date: 03/17/2019 CLINICAL DATA:  Bilateral knee pain status post multiple falls. EXAM: RIGHT KNEE - COMPLETE 4+ VIEW; LEFT KNEE - COMPLETE 4+ VIEW COMPARISON:  None. FINDINGS: There is no acute displaced fracture or dislocation. No significant joint effusion. There are tricompartmental degenerative changes, greatest within the medial compartment. There is no significant prepatellar soft tissue swelling. IMPRESSION: 1. No acute bony abnormality. 2. Tricompartmental degenerative changes, greatest in the medial compartment. Electronically Signed   By: Constance Holster M.D.   On: 03/17/2019 23:43    EKG: Independently reviewed.   Assessment/Plan    Acute metabolic encephalopathy --Patient was acutely confused on arrival in the emergency room but this has since resolved --Etiology unclear but differential includes postictal seizure, complicated by chronic narcotic use, possibly stroke/TIA given abnormality seen on CT head, the patient has no neurologic deficit --And had a slightly subtherapeutic valproate level and admitted to not taking her medication in a month --Valproate restarted in the emergency room --Fall and seizure precautions --Neurology consult in the a.m. --MRI was recommended however and has a spinal stimulator and unable to get MRI --Repeat CT head in 48 hours --Neurologic checks    Cerebral ischemia on CT head --Neurology  consult --Suspicion for acute stroke as patient has no acute deficits correlate with the CT head findings --Repeat CT head in 48 hours as patient unable to get MRI --Aspirin and statins for now pending further advice from neurologist    Depression --- Continue home medication  Chronic back pain with chronic narcotic use --- Patient has a spinal stimulator --Hold narcotics for now  Possible acute seizure with history of seizure disorder ([place]) --Valproic acid level slightly below therapeutic level --- Home antiepileptics continued --Ativan needed seizure  Blisters bilateral heels --Has friction blisters on her heels --Heels elevated    COPD (chronic obstructive pulmonary disease) ([place]) --Continue home bronchodilator therapy. --Not acutely exacerbated    Polypharmacy --Judicious use of home psychotropics    Athena Masse MD [facility]   If 7PM-7AM, please contact night-coverage [email or web address] Password [email or web address]  03/18/2019, 2:27 AM

## 2019-03-18 NOTE — ED Notes (Signed)
Helped pt to bathroom. 

## 2019-03-18 NOTE — ED Notes (Signed)
Pts bed alarming. Pt trying to get out of bed. Pt redirected.

## 2019-03-18 NOTE — ED Notes (Signed)
ASSUMED CARE OF PATIENT PATIENT AOX4, BUT VERY SLEEPY. REPORTS PAIN IN LOWER BACK, HAS STIMULATOR IMPLANT FOR HER BACK PAIN. VSS. FAMILY AT BEDSIDE. AWAITING REPEAT CT HEAD TO R/O POSSIBLE STROKE.

## 2019-03-18 NOTE — ED Notes (Signed)
Report given to receiving nurse, patient transported to 123 via house bed by floor Tech. Family to follow. Belongings with family

## 2019-03-18 NOTE — Progress Notes (Signed)
*  PRELIMINARY RESULTS* Echocardiogram 2D Echocardiogram has been performed.  Sherrie Sport 03/18/2019, 8:38 AM

## 2019-03-18 NOTE — Progress Notes (Signed)
eeg completed ° °

## 2019-03-18 NOTE — ED Notes (Signed)
Awaiting scheduled meds from pharmacy. Family at bedside. Report called and spoke with Ginger secretary awaiting to give report to receiving nurse.

## 2019-03-18 NOTE — ED Notes (Signed)
ECHO being performed at this time. Sitter present at bedside. Pt appears jittery and anxious. Won't sit still.

## 2019-03-18 NOTE — Evaluation (Signed)
Physical Therapy Evaluation Patient Details Name: Rebecca Colon MRN: [id number] DOB: July 16, 1960 Today's Date: 03/18/2019   History of Present Illness  58 y.o. female with History of seizure disorder, chronic pain on chronic narcotic therapy and with spinal stimulator, history of COPD, depression and hypothyroidism who was brought into the emergency room after she was found sitting in her car (apparently over 24 hours).  She arrived in the emergency room confused and disoriented, no obvious sign of seizure, has b/l heel friction blisters.  Head CT indicates possible CVA, unable to have MRI secondary to stimulator. States she had not taken his seizure medicine in over a month.  Clinical Impression  Pt was able to ambulate around the nurses' station with 1 brief rest break.  She has b/l heel blisters so we used FWW most of the time, though she reports she uses cane most of the time (did ~25 ft with single UE today).  She is being admitted for possible CVA (unable to get MRI secondary to implanted stimulator), and though she had some confusion (baselineline) she did not show any overt asymmetry in strength/coordination/etc.  Pt apparently does not have a lot of regular assist (daughter lives 1 hr away, neighbor is very helpful and helps with errands but is elderly and unable to help daily).  From a purely strength/balance/mobility stand-point she is able to mobilize/manage in the home, however there does appear to be some issues with safety awareness, medication management, etc that could be more concerning per continued mental status/confusion issues.  Discussed this with pt and encouraged a more regular check-in schedule (perferably in person rather than simply a call, but at least something).  Pt would benefit from HHPT to address these and work back to more functional and independent status.     Follow Up Recommendations Home health PT;Supervision - Intermittent(pt likely should have QD check-ins )     Equipment Recommendations  None recommended by PT    Recommendations for Other Services       Precautions / Restrictions Precautions Precautions: Fall Restrictions Weight Bearing Restrictions: No      Mobility  Bed Mobility Overal bed mobility: Modified Independent             General bed mobility comments: Pt able to get herself to sitting EOB w/o assist  Transfers Overall transfer level: Modified independent Equipment used: Rolling walker (2 wheeled)             General transfer comment: Pt was able to rise without direct assist.  Showed good confidence  Ambulation/Gait Ambulation/Gait assistance: Supervision Gait Distance (Feet): 135 Feet Assistive device: Rolling walker (2 wheeled)       General Gait Details: Pt needed brief sitting rest break at ~135 ft, did >200 ft around nurses' station.  She used FWW most of the time but was able to do ~25 ft with single UE on hallway rail and ~10 ft with slower and less steady ambulation w/o UEs.  Pt's O2 and HR stable and appropriate t/o the effort with more subjective fatigue.  sitter followed with recliner - pt used once but likely could have done loop w/o sitting if she "had" to  [employer] Mobility    Modified Rankin (Stroke Patients Only)       Balance Overall balance assessment: Modified Independent  Pertinent Vitals/Pain Pain Assessment: 0-10 Pain Score: 7  Pain Location: bottom of heels, and general fibro-type pain    Home Living Family/patient expects to be discharged to:: Private residence Living Arrangements: Alone Available Help at Discharge: Neighbor;Family(neighbor is primary help, )   Home Access: (pt indicates that she does have steps?)       Home Equipment: Walker - 2 wheels;Cane - single point Additional Comments: Pt with some confusion and inconsistency with PLOF, home situation    Prior Function  Level of Independence: Needs assistance   Gait / Transfers Assistance Needed: Pt reports that she uses SPC most of the time, neighbor drives her for errands but she can be on her feet relatively confidently           Hand Dominance        Extremity/Trunk Assessment   Upper Extremity Assessment Upper Extremity Assessment: Generalized weakness(equal strength b/l, unable to elevate b/l shlds >100)    Lower Extremity Assessment Lower Extremity Assessment: Generalized weakness(no focal decicits, b/l functional but weak t/o)       Communication   Communication: No difficulties  Cognition Arousal/Alertness: Lethargic Behavior During Therapy: WFL for tasks assessed/performed Overall Cognitive Status: Difficult to assess                                 General Comments: Unsure of baseline, pt very tired and at time slow to respond.  She did not recall much of the last 24 hours, unsure of date/day but knew location, date and alert to self      General Comments      Exercises     Assessment/Plan    PT Assessment Patient needs continued PT services  PT Problem List Decreased strength;Decreased range of motion;Decreased activity tolerance;Decreased balance;Decreased mobility;Decreased cognition;Decreased knowledge of use of DME;Decreased safety awareness;Cardiopulmonary status limiting activity       PT Treatment Interventions DME instruction;Gait training;Stair training;Functional mobility training;Therapeutic activities;Therapeutic exercise;Neuromuscular re-education;Cognitive remediation;Balance training;Patient/family education    PT Goals (Current goals can be found in the Care Plan section)  Acute Rehab PT Goals Patient Stated Goal: be safe at home PT Goal Formulation: With patient Time For Goal Achievement: 04/01/19 Potential to Achieve Goals: Good    Frequency Min 2X/week   Barriers to discharge        Co-evaluation               AM-PAC  PT "6 Clicks" Mobility  Outcome Measure Help needed turning from your back to your side while in a flat bed without using bedrails?: None Help needed moving from lying on your back to sitting on the side of a flat bed without using bedrails?: None Help needed moving to and from a bed to a chair (including a wheelchair)?: None Help needed standing up from a chair using your arms (e.g., wheelchair or bedside chair)?: None Help needed to walk in hospital room?: A Little Help needed climbing 3-5 steps with a railing? : A Little 6 Click Score: 22    End of Session Equipment Utilized During Treatment: Gait belt Activity Tolerance: Patient limited by fatigue;Patient tolerated treatment well Patient left: in chair;with call bell/phone within reach;with nursing/sitter in room Nurse Communication: Mobility status PT Visit Diagnosis: Muscle weakness (generalized) (M62.81);Difficulty in walking, not elsewhere classified (R26.2)    Time: [id number] PT Time Calculation (min) (ACUTE ONLY): 27 min   Charges:   PT Evaluation $PT Eval Low  Complexity: 1 Low PT Treatments $Gait Training: 8-22 mins        Kreg Shropshire, DPT 03/18/2019, 4:33 PM

## 2019-03-19 LAB — VALPROIC ACID LEVEL: Valproic Acid Lvl: 128 ug/mL — ABNORMAL HIGH (ref 50.0–100.0)

## 2019-03-19 MED ORDER — DIVALPROEX SODIUM 500 MG PO DR TAB
500.0000 mg | DELAYED_RELEASE_TABLET | Freq: Once | ORAL | Status: AC
  Administered 2019-03-19: 500 mg via ORAL
  Filled 2019-03-19: qty 1

## 2019-03-19 NOTE — TOC Initial Note (Signed)
Transition of Care [facility]) - Initial/Assessment Note    Patient Details  Name: Rebecca Colon MRN: [id number] Date of Birth: [birth year]  [facility] CM/SW Contact:    Weston Anna, LCSW Phone Number: 03/19/2019, 1:36 PM  Clinical Narrative:                  CSW spoke with patients daughter, Mendel Ryder, via phone regarding discharge plans. Daughter is open to [facility] at discharge and prefers [place] at his time. CSW reached out to [place] who accepted referral.   Will reach out to [facility] and Mendel Ryder closer to discharge.   Expected Discharge Plan: [place] Barriers to Discharge: Continued Medical Work up   Patient Goals and CMS Choice        Expected Discharge Plan and Services Expected Discharge Plan: [place]                                              Prior Living Arrangements/Services                       Activities of Daily Living Home Assistive Devices/Equipment: Environmental consultant (specify type), Cane (specify quad or straight), CPAP, Bedside commode/3-in-1, Shower chair with back ADL Screening (condition at time of admission) Patient's cognitive ability adequate to safely complete daily activities?: Yes Is the patient deaf or have difficulty hearing?: No Does the patient have difficulty seeing, even when wearing glasses/contacts?: No Does the patient have difficulty concentrating, remembering, or making decisions?: No Patient able to express need for assistance with ADLs?: Yes Does the patient have difficulty dressing or bathing?: No Independently performs ADLs?: Yes (appropriate for developmental age) Does the patient have difficulty walking or climbing stairs?: No Weakness of Legs: None Weakness of Arms/Hands: None  Permission Sought/Granted                  Emotional Assessment              Admission diagnosis:  TIA (transient ischemic attack) [G45.9] Altered mental status  [R41.82] Stroke ([place]) [I63.9] Altered mental status, unspecified altered mental status type [R41.82] Patient Active Problem List   Diagnosis Date Noted  . Chronic narcotic use 03/18/2019  . Seizure disorder ([place]) 03/18/2019  . COPD (chronic obstructive pulmonary disease) ([place]) 03/18/2019  . Polypharmacy 03/18/2019  . Altered mental status 03/18/2019  . Cerebral ischemia on CT head 03/18/2019  . Stroke ([place]) 03/18/2019  . Acute metabolic encephalopathy [id number]  . Cellulitis of breast 11/12/2017  . Cellulitis 11/11/2017  . UTI (urinary tract infection) 07/08/2017  . Anxiety 07/08/2017  . Depression 07/08/2017   PCP:  System, Pcp Not In Pharmacy:   [id number] [place] [place] [address] Phone: [phone] Fax: [phone]     Social Determinants of Health (SDOH) Interventions    Readmission Risk Interventions No flowsheet data found.

## 2019-03-19 NOTE — Evaluation (Addendum)
Occupational Therapy Evaluation Patient Details Name: Rebecca Colon MRN: [id number] DOB: 10/23/1960 Today's Date: 03/19/2019    History of Present Illness 58 y.o. female with History of seizure disorder, chronic pain on chronic narcotic therapy and with spinal stimulator, history of COPD, depression and hypothyroidism who was brought into the emergency room after she was found sitting in her car (apparently over 24 hours).  She arrived in the emergency room confused and disoriented, no obvious sign of seizure, has b/l heel friction blisters.  Head CT indicates possible CVA, unable to have MRI secondary to stimulator. States she had not taken his seizure medicine in over a month.   Clinical Impression   Pt seen for OT evaluation this date. Prior to hospital admission, pt lived alone, was MOD I with fxl mobility with SPC, was Indep with self care ADLs, and was driving herself occasionally-endorses that she probably shouldn't drive and that her neighbor will take her on errands occasionally. Currently pt demonstrates impairments in standing tolerance and balance requiring CGA to MIN A with self care ADLs and ADL transfers.  Pt would benefit from skilled OT to address noted impairments and functional limitations (see below for any additional details) in order to maximize safety and independence while minimizing falls risk and caregiver burden. Upon hospital discharge, recommend pt discharge to home with [facility] and intermittent supervision for higher level balance tasks or high fall risk ADLs such as bathing.    Follow Up Recommendations  Home health OT;Supervision - Intermittent    Equipment Recommendations  3 in 1 bedside commode    Recommendations for Other Services       Precautions / Restrictions Precautions Precautions: Fall Restrictions Weight Bearing Restrictions: No      Mobility Bed Mobility Overal bed mobility: Modified Independent             General bed mobility comments:  requires increased time, HOB elevated  Transfers Overall transfer level: Needs assistance Equipment used: Rolling walker (2 wheeled) Transfers: Sit to/from Stand Sit to Stand: Min guard;Min assist         General transfer comment: MIN A with SPC on initial sit to stand trial, CGA on second trial with FWW with MIN verbal cues for safe hand placement.    Balance Overall balance assessment: Mild deficits observed, not formally tested                                         ADL either performed or assessed with clinical judgement   ADL Overall ADL's : Needs assistance/impaired Eating/Feeding: Set up;Sitting   Grooming: Wash/dry hands;Wash/dry face;Oral care;Supervision/safety;Standing Grooming Details (indicate cue type and reason): standing sink-side with RW Upper Body Bathing: Set up;Sitting   Lower Body Bathing: Minimal assistance;Sit to/from stand   Upper Body Dressing : Set up;Sitting   Lower Body Dressing: Minimal assistance;Sit to/from stand Lower Body Dressing Details (indicate cue type and reason): with RW Toilet Transfer: Min guard;Grab bars;Ambulation;RW;Regular Museum/gallery exhibitions officer and Hygiene: Min guard;Sit to/from stand       Functional mobility during ADLs: Min guard;Cueing for safety;Rolling walker       Vision Patient Visual Report: No change from baseline       Perception     Praxis      Pertinent Vitals/Pain Pain Assessment: No/denies pain Pain Location: Pt states the bottoms of her heels where blisters are hurts  some when pressure through her feet, but does not bother her. Pt does not quantify pain.     Hand Dominance     Extremity/Trunk Assessment Upper Extremity Assessment Upper Extremity Assessment: Generalized weakness(can move against gravity to ~100-110 degrees flexion, but not throughout full arc.)   Lower Extremity Assessment Lower Extremity Assessment: Defer to PT evaluation;Generalized  weakness       Communication Communication Communication: No difficulties   Cognition Arousal/Alertness: Awake/alert Behavior During Therapy: WFL for tasks assessed/performed Overall Cognitive Status: Within Functional Limits for tasks assessed                                 General Comments: Pt with some increased processing time, aware that she is still a little "fuzzy" but overall appropriate-A&O x4.   General Comments       Exercises Other Exercises Other Exercises: OT facilitates education with pt re: role of OT in acute setting with pt verbalizing understanding. Other Exercises: OT facilitates education with pt re: safe hand palcement with use of FWW for sit<>Stand, pt demos understanding.   Shoulder Instructions      Home Living Family/patient expects to be discharged to:: Private residence Living Arrangements: Alone Available Help at Discharge: Neighbor;Family(neighbor primarily checks on pt, drives pt when pt cannot drive. Pt states dtr can help some as well.)   Home Access: Stairs to enter Entrance Stairs-Number of Steps: unsure                   Home Equipment: Walker - 2 wheels;Cane - single point          Prior Functioning/Environment Level of Independence: Needs assistance  Gait / Transfers Assistance Needed: Pt reports primarily using SPC for fxl mobility. ADL's / Homemaking Assistance Needed: Pt reports performing most self care including IADLs I'ly as well as driving. However, states that her daughter does not want her to drive any more for safety. Reports she has neighbor who can help transport for errands.   Comments: endorses falls at home, not within recent year, but prior incidences following surgeries related to BRCA and mastectomy.        OT Problem List: Decreased strength;Decreased range of motion;Decreased activity tolerance;Impaired balance (sitting and/or standing);Decreased safety awareness;Decreased knowledge of use  of DME or AE      OT Treatment/Interventions: Self-care/ADL training;Therapeutic exercise;Energy conservation;DME and/or AE instruction;Therapeutic activities;Patient/family education;Balance training    OT Goals(Current goals can be found in the care plan section) Acute Rehab OT Goals Patient Stated Goal: be safe at home OT Goal Formulation: With patient Time For Goal Achievement: 04/02/19 Potential to Achieve Goals: Good  OT Frequency: Min 1X/week   Barriers to D/C:            Co-evaluation              AM-PAC OT "6 Clicks" Daily Activity     Outcome Measure Help from another person eating meals?: None Help from another person taking care of personal grooming?: A Little Help from another person toileting, which includes using toliet, bedpan, or urinal?: A Little Help from another person bathing (including washing, rinsing, drying)?: A Little Help from another person to put on and taking off regular upper body clothing?: None Help from another person to put on and taking off regular lower body clothing?: A Little 6 Click Score: 20   End of Session Equipment Utilized During Treatment: Gait belt;Rolling walker  Nurse Communication: Mobility status  Activity Tolerance: Patient tolerated treatment well Patient left: in bed;with call bell/phone within reach;with bed alarm set  OT Visit Diagnosis: Unsteadiness on feet (R26.81);History of falling (Z91.81)                Time: 4268-3419 OT Time Calculation (min): 25 min Charges:  OT General Charges $OT Visit: 1 Visit OT Evaluation $OT Eval Moderate Complexity: 1 Mod OT Treatments $Self Care/Home Management : 8-22 mins  Gerrianne Scale, MS, OTR/L ascom [phone] 03/19/19, 4:57 PM

## 2019-03-19 NOTE — Progress Notes (Signed)
SLP Cancellation Note  Patient Details Name: Rebecca Colon MRN: [id number] DOB: 10/23/1960   Cancelled treatment:       Reason Eval/Treat Not Completed: SLP screened, no needs identified, will sign off  Patient reports that she is "still a bit foggy" but much better than yesterday and almost at baseline.  The patient does not require speech therapy at this time.  She was counseled to seek referral to neurology and speech therapy if she finds that her cognitive symptoms do not resolve.  Leroy Sea, MS/CCC- SLP  Lou Miner 03/19/2019, 10:34 AM

## 2019-03-19 NOTE — Progress Notes (Addendum)
Brief history: 58 y.o. female with History of seizure disorder, chronic pain on chronic narcotic therapy and with spinal stimulator, history of COPD, depression and hypothyroidism who was brought into the emergency room after she was found sitting in her car.  She was apparently sitting there for over 24 hours per report given to EMS.  She arrived in the emergency room she was reportedly confused and disoriented.  She complained of knee pain and pains at the bottom of her heels she was noted to have friction blisters.  States she had not taken his seizure medicine in over a month.  Said she was in her usual state of health.  Said that she was just very sleepy.  Unknown whether she had a seizure.  No stigmata of seizure, no bitten tongue no urinary incontinence.  In the emergency room, vitals were within normal limits.  Valproate level was slightly decreased at 48.  Her blood work was mostly unremarkable.  Urinalysis was negative.  CT head was significant for findings of acute ischemia with recommendation for MRI.  Patient could not get MRI because of a spinal cord stimulator.  Subjective: No new seizure or seizure-like activities.  Patient says she feels better but very tired and sleepy.  Denies any focal neurological deficit at this moment.  Objective: Vital signs in last 24 hours: Temp:  [97.8 F (36.6 C)-98.2 F (36.8 C)] 97.8 F (36.6 C) (12/16 1506) Pulse Rate:  [62-66] 62 (12/16 1506) Resp:  [16] 16 (12/16 1506) BP: (102-108)/(51-68) 108/68 (12/16 1506) SpO2:  [96 %-97 %] 96 % (12/16 1506)  Intake/Output from previous day: 12/15 0701 - 12/16 0700 In: 1731.3 [P.O.:240; I.V.:1436.3] Out: -  Intake/Output this shift: Total I/O In: 491.5 [I.V.:491.5] Out: -   General: Appears tired Eyes: PERRL, lids and conjunctivae normal ENMT: Mucous membranes are moist. Posterior pharynx clear of any exudate or lesions.Normal dentition.  Respiratory: clear to auscultation bilaterally, no wheezing,  no crackles. Normal respiratory effort. No accessory muscle use.  Cardiovascular: Regular rate and rhythm, no murmurs / rubs / gallops. No extremity edema. 2+ pedal pulses. No carotid bruits.  Abdomen: no tenderness, no masses palpated. No hepatosplenomegaly. Bowel sounds positive.  Musculoskeletal: no clubbing / cyanosis. No joint deformity upper and lower extremities. Good ROM, no contractures. Normal muscle tone.  Skin: friction blisters bilateral heels; Mepilex is on neurologic: CN 2-12 grossly intact. Sensation intact, DTR normal. Strength 5/5 in all 4.  Psychiatric: Normal judgment and insight. Alert and oriented x 3. Normal mood.   Results for orders placed or performed during the hospital encounter of 03/17/19 (from the past 24 hour(s))  Valproic acid level     Status: Abnormal   Collection Time: 03/19/19  5:57 AM  Result Value Ref Range   Valproic Acid Lvl 128 (H) 50.0 - 100.0 ug/mL    Studies/Results: EEG  Result Date: 03/18/2019 Alexis Goodell, MD     03/18/2019  5:57 PM ELECTROENCEPHALOGRAM REPORT Patient: Tona Sensing       Room #: 123A-AA EEG No. ID: [id number] Age: 58 y.o.        Sex: female Referring Physician: Mal Misty Report Date:  03/18/2019       Interpreting Physician: Alexis Goodell History: YARETHZI VANDALEN is an 58 y.o. female with altered mental status Medications: Lipitor, Depakote, Cymbalta, Neurontin, MS Contin, Lyrica, Spiriva, Trazadone Conditions of Recording:  This is a 21 channel routine scalp EEG performed with bipolar and monopolar montages arranged in accordance to the international 10/20  system of electrode placement. One channel was dedicated to EKG recording. The patient is in the altered state. Description:  The background activity is slow and poorly organized.  It consists of a low to moderate voltage mixture of theta and delta activity with a maximum posterior background rhythm of 6Hz .  There are intermittent periods when the background slows even further and  bifrontal sharp waves are noted. Hyperventilation was not performed.   Intermittent photic stimulation was performed but failed to illicit any change in the tracing. IMPRESSION: This is an abnormal electroencephalogram secondary to general background slowing with intermittent periods of bifrontal sharp waves consistent with the patient's history of seizures.  No electrographic seizures are noted. Alexis Goodell, MD Neurology [phone] 03/18/2019, 5:51 PM   CT Head Wo Contrast  Result Date: 03/17/2019 CLINICAL DATA:  Altered mental status, unclear history of seizures EXAM: CT HEAD WITHOUT CONTRAST TECHNIQUE: Contiguous axial images were obtained from the base of the skull through the vertex without intravenous contrast. COMPARISON:  CT head December 02, 2018 FINDINGS: Brain: Some cortically based hypoattenuation is seen in the left temporal lobe. No other sites concerning for acute infarct. No evidence of hemorrhage, extra-axial collection, mass effect or midline shift. Patchy areas of white matter hypoattenuation are most compatible with chronic microvascular angiopathy. No evidence of acute infarction, hemorrhage, hydrocephalus, extra-axial collection or mass lesion/mass effect. Symmetric prominence of the ventricles, cisterns and sulci compatible with parenchymal volume loss. Patchy areas of white matter hypoattenuation are most compatible with chronic microvascular angiopathy. Vascular: No hyperdense vessel or unexpected calcification. Skull: No calvarial fracture or suspicious osseous lesion. No scalp swelling or hematoma. Sinuses/Orbits: Paranasal sinuses and mastoid air cells are predominantly clear. Included orbital structures are unremarkable. Other: None IMPRESSION: Some cortically based hypoattenuation is seen in the left temporal lobe worrisome for acute/subacute ischemia. Could consider further evaluation with MRI. Features of mild frontal predominant parenchymal volume loss and chronic  microvascular angiopathy. Electronically Signed   By: Lovena Le M.D.   On: 03/17/2019 23:30   US Carotid Bilateral (at [facility] and [facility] only)  Result Date: 03/18/2019 CLINICAL DATA:  TIA.  Syncopal episode.  Former smoker. EXAM: BILATERAL CAROTID DUPLEX ULTRASOUND TECHNIQUE: Pearline Cables scale imaging, color Doppler and duplex ultrasound were performed of bilateral carotid and vertebral arteries in the neck. COMPARISON:  None. FINDINGS: Criteria: Quantification of carotid stenosis is based on velocity parameters that correlate the residual internal carotid diameter with NASCET-based stenosis levels, using the diameter of the distal internal carotid lumen as the denominator for stenosis measurement. The following velocity measurements were obtained: RIGHT ICA: 47/9 cm/sec CCA: 0000000 cm/sec SYSTOLIC ICA/CCA RATIO:  0.7 ECA: 73 cm/sec LEFT ICA: 38/8 cm/sec CCA: 123456 cm/sec SYSTOLIC ICA/CCA RATIO:  0.4 ECA: 62 cm/sec RIGHT CAROTID ARTERY: There is no grayscale evidence of significant intimal thickening or atherosclerotic plaque affecting the interrogated portions of the right carotid system. There are no elevated peak systolic velocities within the interrogated course of the right internal carotid artery to suggest a hemodynamically significant stenosis. RIGHT VERTEBRAL ARTERY:  Antegrade flow LEFT CAROTID ARTERY: There is no grayscale evidence of significant intimal thickening or atherosclerotic plaque affecting the interrogated portions of the left carotid system. There are no elevated peak systolic velocities within the interrogated course of the left internal carotid artery to suggest a hemodynamically significant stenosis. LEFT VERTEBRAL ARTERY:  Antegrade flow IMPRESSION: Normal carotid Doppler ultrasound. Electronically Signed   By: Sandi Mariscal M.D.   On: 03/18/2019 09:46   DG  Knee Complete 4 Views Left  Result Date: 03/17/2019 CLINICAL DATA:  Bilateral knee pain status post multiple falls. EXAM: RIGHT KNEE -  COMPLETE 4+ VIEW; LEFT KNEE - COMPLETE 4+ VIEW COMPARISON:  None. FINDINGS: There is no acute displaced fracture or dislocation. No significant joint effusion. There are tricompartmental degenerative changes, greatest within the medial compartment. There is no significant prepatellar soft tissue swelling. IMPRESSION: 1. No acute bony abnormality. 2. Tricompartmental degenerative changes, greatest in the medial compartment. Electronically Signed   By: Constance Holster M.D.   On: 03/17/2019 23:43   DG Knee Complete 4 Views Right  Result Date: 03/17/2019 CLINICAL DATA:  Bilateral knee pain status post multiple falls. EXAM: RIGHT KNEE - COMPLETE 4+ VIEW; LEFT KNEE - COMPLETE 4+ VIEW COMPARISON:  None. FINDINGS: There is no acute displaced fracture or dislocation. No significant joint effusion. There are tricompartmental degenerative changes, greatest within the medial compartment. There is no significant prepatellar soft tissue swelling. IMPRESSION: 1. No acute bony abnormality. 2. Tricompartmental degenerative changes, greatest in the medial compartment. Electronically Signed   By: Constance Holster M.D.   On: 03/17/2019 23:43   ECHOCARDIOGRAM COMPLETE  Result Date: 03/18/2019   ECHOCARDIOGRAM REPORT   Patient Name:   FINN WEINS Date of Exam: 03/18/2019 Medical Rec #:  [id number]      Height:       67.0 in Accession #:    [id number]     Weight:       124.0 lb Date of Birth:  12-15-1960      BSA:          1.65 m Patient Age:    89 years       BP:           113/95 mmHg Patient Gender: F              HR:           60 bpm. Exam Location:  [facility] Procedure: 2D Echo, Cardiac Doppler and Color Doppler Indications:     Stroke 434.91  History:         Patient has no prior history of Echocardiogram examinations.                  Breast cancer, DVT.  Sonographer:     Sherrie Sport RDCS (AE) Referring Phys:  [id number] Athena Masse Diagnosing Phys: Neoma Laming MD  Sonographer Comments: Technically difficult study due to  poor echo windows. Image acquisition challenging due to mastectomy and Left breast implant. IMPRESSIONS  1. Left ventricular ejection fraction, by visual estimation, is 60 to 65%. The left ventricle has normal function. There is borderline left ventricular hypertrophy.  2. Left ventricular diastolic function could not be evaluated.  3. The left ventricle has no regional wall motion abnormalities.  4. Global right ventricle was not assessed.The right ventricular size is not assessed. Right vetricular wall thickness was not assessed.  5. Left atrial size was not assessed.  6. Right atrial size was not assessed.  7. The pericardium was not assessed.  8. The mitral valve was not assessed. No evidence of mitral valve regurgitation.  9. The tricuspid valve is not assessed. Tricuspid valve regurgitation is not demonstrated. 10. The aortic valve was not assessed. Aortic valve regurgitation is not visualized. 11. The pulmonic valve was not assessed. Pulmonic valve regurgitation is not visualized. 12. Aortic root could not be assessed. 13. The interatrial septum was not assessed. FINDINGS  Left Ventricle: Left ventricular  ejection fraction, by visual estimation, is 60 to 65%. The left ventricle has normal function. The left ventricle has no regional wall motion abnormalities. There is borderline left ventricular hypertrophy. Left ventricular diastolic function could not be evaluated. Right Ventricle: The right ventricular size is not assessed. Right vetricular wall thickness was not assessed. Global RV systolic function is was not assessed. Left Atrium: Left atrial size was not assessed. Right Atrium: Right atrial size was not assessed Pericardium: The pericardium was not assessed. Mitral Valve: The mitral valve was not assessed. No evidence of mitral valve regurgitation. Tricuspid Valve: The tricuspid valve is not assessed. Tricuspid valve regurgitation is not demonstrated. Aortic Valve: The aortic valve was not assessed.  Aortic valve regurgitation is not visualized. Pulmonic Valve: The pulmonic valve was not assessed. Pulmonic valve regurgitation is not visualized. Pulmonic regurgitation is not visualized. Aorta: Aortic root could not be assessed. IAS/Shunts: The interatrial septum was not assessed.  LEFT VENTRICLE PLAX 2D LVIDd:         3.88 cm LVIDs:         2.55 cm LV PW:         1.59 cm LV IVS:        1.14 cm LV SV:         42 ml LV SV Index:   25.66  LEFT ATRIUM         Index LA diam:    4.50 cm 2.73 cm/m  PULMONIC VALVE PV Vmax:        0.98 m/s PV Peak grad:   3.9 mmHg RVOT Peak grad: 7 mmHg   Neoma Laming MD Electronically signed by Neoma Laming MD Signature Date/Time: 03/18/2019/3:38:04 PM    Final     Scheduled Meds: .  stroke: mapping our early stages of recovery book   Does not apply Once  . aspirin  300 mg Rectal Daily   Or  . aspirin EC  325 mg Oral Daily  . atorvastatin  10 mg Oral q1800  . divalproex  500 mg Oral Once  . DULoxetine  60 mg Oral QHS  . enoxaparin (LOVENOX) injection  40 mg Subcutaneous Q24H  . gabapentin  600 mg Oral TID  . montelukast  10 mg Oral Daily  . morphine  60 mg Oral Q12H  . pregabalin  200 mg Oral TID  . tiotropium  18 mcg Inhalation Daily  . traZODone  100 mg Oral QHS   Continuous Infusions: PRN Meds:albuterol, baclofen, hydrOXYzine, morphine  Assessment/Plan: Acute metabolic encephalopathy: Improving -Suspected to have post ictal phenomena --Patient was acutely confused on arrival in the emergency room but this has since resolved -Valproic acid on admission was found to be 48.  IV Depakote x1 given.  Repeat level found to be 128. --Valproic acid dose per neurology --Fall and seizure precautions --Appreciate neurology recommendation --MRI was recommended however and has a spinal stimulator and unable to get MRI --Repeat CT scan of head tomorrow morning -Dispose aspirin 300 mg.  Further aspirin per neurology --Continue neuro check close monitoring -   Cerebral ischemia suspected but not consistent with physical exam finding and imaging findings at this point --Neurology consult --Continue aspirin and statins for now pending further advice from neurologist    Depression: No acute issues.  Denies any suicidal ideas or thoughts and also denies any homicidal thoughts or ideas. --- Continue home medication  Chronic back pain with chronic narcotic use --- Patient has a spinal stimulator --Continue to hold narcotics for now -  Gabapentin and Lyrica home medication to be continued  Possible acute seizure with history of seizure disorder ([place]) --Valproic acid level slightly as mentioned above --- EEG done on 03/18/2019 did not show any electrographic seizure -Continue valproic acid as per neurology --Ativan needed seizure  Blisters bilateral heels --Has friction blisters on her heels --Heels elevated -Mepilex on -Dose monitoring    COPD (chronic obstructive pulmonary disease) (HCC) --Continue home bronchodilator therapy. --Not acutely exacerbated    Polypharmacy --Judicious use of home psychotropics   LOS: 1 day   Syretta Kochel Izetta Dakin

## 2019-03-19 NOTE — Progress Notes (Signed)
Physical Therapy Treatment Patient Details Name: Rebecca Colon MRN: [id number] DOB: Mar 23, 1961 Today's Date: 03/19/2019    History of Present Illness 58 y.o. female with History of seizure disorder, chronic pain on chronic narcotic therapy and with spinal stimulator, history of COPD, depression and hypothyroidism who was brought into the emergency room after she was found sitting in her car (apparently over 24 hours).  She arrived in the emergency room confused and disoriented, no obvious sign of seizure, has b/l heel friction blisters.  Head CT indicates possible CVA, unable to have MRI secondary to stimulator. States she had not taken his seizure medicine in over a month.    PT Comments    Pt is making good progress towards goals with ability to ambulate 2 laps around RN station this date without break needed. Still needs cues for safety and balance. She reports she doesn't feel up to 100% yet and is still very sleepy, although improved alertness this date. Prefers to go back to bed after ambulation to take a nap. Will continue to progress. REcommend continued use of RW at this time for safety.   Follow Up Recommendations  Home health PT;Supervision - Intermittent     Equipment Recommendations  None recommended by PT    Recommendations for Other Services       Precautions / Restrictions Precautions Precautions: Fall Restrictions Weight Bearing Restrictions: No    Mobility  Bed Mobility Overal bed mobility: Modified Independent             General bed mobility comments: safe technique  Transfers Overall transfer level: Modified independent Equipment used: Rolling walker (2 wheeled)             General transfer comment: safe technique with upright posture. Cues for static standing and self assessment prior to begin mobility.  Ambulation/Gait Ambulation/Gait assistance: Min guard Gait Distance (Feet): 350 Feet Assistive device: Rolling walker (2 wheeled) Gait  Pattern/deviations: Step-through pattern Gait velocity: ambulates 10' in 8 seconds   General Gait Details: ambulated 2 laps around RN station with reciprocal gait pattern. Able to carry conversation during ambulation. Occasionally needs cues for safety with use of RW as she will take her hands off to adjust mask and runs into the walker. She also needs cues for obstacle avoidance in hallway.   Stairs             Wheelchair Mobility    Modified Rankin (Stroke Patients Only)       Balance Overall balance assessment: Modified Independent                                          Cognition Arousal/Alertness: Awake/alert Behavior During Therapy: WFL for tasks assessed/performed Overall Cognitive Status: Within Functional Limits for tasks assessed                                        Exercises      General Comments        Pertinent Vitals/Pain Pain Assessment: No/denies pain    Home Living                      Prior Function            PT Goals (current goals can now be found in  the care plan section) Acute Rehab PT Goals Patient Stated Goal: be safe at home PT Goal Formulation: With patient Time For Goal Achievement: 04/01/19 Potential to Achieve Goals: Good Progress towards PT goals: Progressing toward goals    Frequency    Min 2X/week      PT Plan Current plan remains appropriate    Co-evaluation              AM-PAC PT "6 Clicks" Mobility   Outcome Measure  Help needed turning from your back to your side while in a flat bed without using bedrails?: None Help needed moving from lying on your back to sitting on the side of a flat bed without using bedrails?: None Help needed moving to and from a bed to a chair (including a wheelchair)?: None Help needed standing up from a chair using your arms (e.g., wheelchair or bedside chair)?: None Help needed to walk in hospital room?: A Little Help needed  climbing 3-5 steps with a railing? : A Little 6 Click Score: 22    End of Session Equipment Utilized During Treatment: Gait belt Activity Tolerance: Patient tolerated treatment well Patient left: in bed;with bed alarm set Nurse Communication: Mobility status PT Visit Diagnosis: Muscle weakness (generalized) (M62.81);Difficulty in walking, not elsewhere classified (R26.2)     Time: [id number] PT Time Calculation (min) (ACUTE ONLY): 23 min  Charges:  $Gait Training: 23-37 mins                     Greggory Stallion, [place], DPT [phone]    Rebecca Colon 03/19/2019, 1:38 PM

## 2019-03-19 NOTE — Progress Notes (Signed)
Subjective: Patient much improved today.  Appears close to baseline.    Objective: Current vital signs: BP (!) 107/59 (BP Location: Left Arm)   Pulse 66   Temp 98.2 F (36.8 C)   Resp 16   Ht 5\' 7"  (1.702 m)   Wt 56.2 kg   SpO2 97%   BMI 19.42 kg/m  Vital signs in last 24 hours: Temp:  [97.9 F (36.6 C)-98.4 F (36.9 C)] 98.2 F (36.8 C) (12/16 0450) Pulse Rate:  [46-66] 66 (12/16 0450) Resp:  [16-20] 16 (12/16 0450) BP: (102-126)/(51-64) 107/59 (12/16 0450) SpO2:  [90 %-97 %] 97 % (12/16 0450)  Intake/Output from previous day: 12/15 0701 - 12/16 0700 In: 1731.3 [P.O.:240; I.V.:1436.3; IV Piggyback:55] Out: -  Intake/Output this shift: No intake/output data recorded. Nutritional status:  Diet Order            Diet 2 gram sodium Room service appropriate? Yes; Fluid consistency: Thin  Diet effective now              Neurologic Exam: Mental Status: Lethargic.  Slightly agitated.  Speech fluent without evidence of aphasia.  Able to follow 3 step commands without difficulty. Cranial Nerves: II: Discs flat bilaterally; Visual fields grossly normal, pupils equal, round, reactive to light and accommodation III,IV, VI: ptosis not present, extra-ocular motions intact bilaterally V,VII: smile symmetric, facial light touch sensation normal bilaterally VIII: hearing normal bilaterally IX,X: gag reflex present XI: bilateral shoulder shrug XII: midline tongue extension Motor: 5/5 throughout Sensory: Pinprick and light touch intact throughout, bilaterally   Lab Results: Basic Metabolic Panel: Recent Labs  Lab 03/17/19 2114  NA 139  K 3.4*  CL 101  CO2 25  GLUCOSE 122*  BUN 17  CREATININE 0.62  CALCIUM 9.4    Liver Function Tests: Recent Labs  Lab 03/17/19 2114  AST 28  ALT 16  ALKPHOS 51  BILITOT 1.3*  PROT 7.5  ALBUMIN 4.1   No results for input(s): LIPASE, AMYLASE in the last 168 hours. No results for input(s): AMMONIA in the last 168  hours.  CBC: Recent Labs  Lab 03/17/19 2114 03/18/19 0042  WBC 8.9 10.5  HGB 15.0 14.2  HCT 42.8 39.7  MCV 90.7 90.0  PLT 204 184    Cardiac Enzymes: Recent Labs  Lab 03/18/19 0954  CKTOTAL 453*    Lipid Panel: Recent Labs  Lab 03/18/19 0954  CHOL 177  TRIG 101  HDL 45  CHOLHDL 3.9  VLDL 20  LDLCALC 112*    CBG: No results for input(s): GLUCAP in the last 168 hours.  Microbiology: Results for orders placed or performed during the hospital encounter of 03/17/19  SARS CORONAVIRUS 2 (TAT 6-24 HRS) Nasopharyngeal Nasopharyngeal Swab     Status: None   Collection Time: 03/18/19 12:42 AM   Specimen: Nasopharyngeal Swab  Result Value Ref Range Status   SARS Coronavirus 2 NEGATIVE NEGATIVE Final    Comment: (NOTE) SARS-CoV-2 target nucleic acids are NOT DETECTED. The SARS-CoV-2 RNA is generally detectable in upper and lower respiratory specimens during the acute phase of infection. Negative results do not preclude SARS-CoV-2 infection, do not rule out co-infections with other pathogens, and should not be used as the sole basis for treatment or other patient management decisions. Negative results must be combined with clinical observations, patient history, and epidemiological information. The expected result is Negative. Fact Sheet for Patients: SugarRoll.be Fact Sheet for Healthcare Providers: https://www.woods-mathews.com/ This test is not yet approved or cleared by the  [place] Architectural technologist and  has been authorized for detection and/or diagnosis of SARS-CoV-2 by FDA under an Print production planner (EUA). This EUA will remain  in effect (meaning this test can be used) for the duration of the COVID-19 declaration under Section 56 4(b)(1) of the Act, 21 U.S.C. section 360bbb-3(b)(1), unless the authorization is terminated or revoked sooner. Performed at [facility], [place] [address], [place], [place] [address]      Coagulation Studies: No results for input(s): LABPROT, INR in the last 72 hours.  Imaging: EEG  Result Date: 03/18/2019 Alexis Goodell, MD     03/18/2019  5:57 PM ELECTROENCEPHALOGRAM REPORT Patient: Rebecca Colon       Room #: [id number] EEG No. ID: [id number] Age: 58 y.o.        Sex: female Referring Physician: Mal Misty Report Date:  03/18/2019       Interpreting Physician: Alexis Goodell History: Rebecca Colon is an 58 y.o. female with altered mental status Medications: Lipitor, Depakote, Cymbalta, Neurontin, MS Contin, Lyrica, Spiriva, Trazadone Conditions of Recording:  This is a 21 channel routine scalp EEG performed with bipolar and monopolar montages arranged in accordance to the international 10/20 system of electrode placement. One channel was dedicated to EKG recording. The patient is in the altered state. Description:  The background activity is slow and poorly organized.  It consists of a low to moderate voltage mixture of theta and delta activity with a maximum posterior background rhythm of 6Hz .  There are intermittent periods when the background slows even further and bifrontal sharp waves are noted. Hyperventilation was not performed.   Intermittent photic stimulation was performed but failed to illicit any change in the tracing. IMPRESSION: This is an abnormal electroencephalogram secondary to general background slowing with intermittent periods of bifrontal sharp waves consistent with the patient's history of seizures.  No electrographic seizures are noted. Alexis Goodell, MD Neurology [phone] 03/18/2019, 5:51 PM   CT Head Wo Contrast  Result Date: 03/17/2019 CLINICAL DATA:  Altered mental status, unclear history of seizures EXAM: CT HEAD WITHOUT CONTRAST TECHNIQUE: Contiguous axial images were obtained from the base of the skull through the vertex without intravenous contrast. COMPARISON:  CT head December 02, 2018 FINDINGS: Brain: Some cortically based hypoattenuation is seen  in the left temporal lobe. No other sites concerning for acute infarct. No evidence of hemorrhage, extra-axial collection, mass effect or midline shift. Patchy areas of white matter hypoattenuation are most compatible with chronic microvascular angiopathy. No evidence of acute infarction, hemorrhage, hydrocephalus, extra-axial collection or mass lesion/mass effect. Symmetric prominence of the ventricles, cisterns and sulci compatible with parenchymal volume loss. Patchy areas of white matter hypoattenuation are most compatible with chronic microvascular angiopathy. Vascular: No hyperdense vessel or unexpected calcification. Skull: No calvarial fracture or suspicious osseous lesion. No scalp swelling or hematoma. Sinuses/Orbits: Paranasal sinuses and mastoid air cells are predominantly clear. Included orbital structures are unremarkable. Other: None IMPRESSION: Some cortically based hypoattenuation is seen in the left temporal lobe worrisome for acute/subacute ischemia. Could consider further evaluation with MRI. Features of mild frontal predominant parenchymal volume loss and chronic microvascular angiopathy. Electronically Signed   By: Lovena Le M.D.   On: 03/17/2019 23:30   US Carotid Bilateral (at [facility] and [facility] only)  Result Date: 03/18/2019 CLINICAL DATA:  TIA.  Syncopal episode.  Former smoker. EXAM: BILATERAL CAROTID DUPLEX ULTRASOUND TECHNIQUE: Pearline Cables scale imaging, color Doppler and duplex ultrasound were performed of bilateral carotid and vertebral arteries in the  neck. COMPARISON:  None. FINDINGS: Criteria: Quantification of carotid stenosis is based on velocity parameters that correlate the residual internal carotid diameter with NASCET-based stenosis levels, using the diameter of the distal internal carotid lumen as the denominator for stenosis measurement. The following velocity measurements were obtained: RIGHT ICA: 47/9 cm/sec CCA: 0000000 cm/sec SYSTOLIC ICA/CCA RATIO:  0.7 ECA: 73 cm/sec LEFT ICA:  38/8 cm/sec CCA: 123456 cm/sec SYSTOLIC ICA/CCA RATIO:  0.4 ECA: 62 cm/sec RIGHT CAROTID ARTERY: There is no grayscale evidence of significant intimal thickening or atherosclerotic plaque affecting the interrogated portions of the right carotid system. There are no elevated peak systolic velocities within the interrogated course of the right internal carotid artery to suggest a hemodynamically significant stenosis. RIGHT VERTEBRAL ARTERY:  Antegrade flow LEFT CAROTID ARTERY: There is no grayscale evidence of significant intimal thickening or atherosclerotic plaque affecting the interrogated portions of the left carotid system. There are no elevated peak systolic velocities within the interrogated course of the left internal carotid artery to suggest a hemodynamically significant stenosis. LEFT VERTEBRAL ARTERY:  Antegrade flow IMPRESSION: Normal carotid Doppler ultrasound. Electronically Signed   By: Sandi Mariscal M.D.   On: 03/18/2019 09:46   DG Knee Complete 4 Views Left  Result Date: 03/17/2019 CLINICAL DATA:  Bilateral knee pain status post multiple falls. EXAM: RIGHT KNEE - COMPLETE 4+ VIEW; LEFT KNEE - COMPLETE 4+ VIEW COMPARISON:  None. FINDINGS: There is no acute displaced fracture or dislocation. No significant joint effusion. There are tricompartmental degenerative changes, greatest within the medial compartment. There is no significant prepatellar soft tissue swelling. IMPRESSION: 1. No acute bony abnormality. 2. Tricompartmental degenerative changes, greatest in the medial compartment. Electronically Signed   By: Constance Holster M.D.   On: 03/17/2019 23:43   DG Knee Complete 4 Views Right  Result Date: 03/17/2019 CLINICAL DATA:  Bilateral knee pain status post multiple falls. EXAM: RIGHT KNEE - COMPLETE 4+ VIEW; LEFT KNEE - COMPLETE 4+ VIEW COMPARISON:  None. FINDINGS: There is no acute displaced fracture or dislocation. No significant joint effusion. There are tricompartmental degenerative  changes, greatest within the medial compartment. There is no significant prepatellar soft tissue swelling. IMPRESSION: 1. No acute bony abnormality. 2. Tricompartmental degenerative changes, greatest in the medial compartment. Electronically Signed   By: Constance Holster M.D.   On: 03/17/2019 23:43   ECHOCARDIOGRAM COMPLETE  Result Date: 03/18/2019   ECHOCARDIOGRAM REPORT   Patient Name:   Rebecca Colon Date of Exam: 03/18/2019 Medical Rec #:  [id number]      Height:       67.0 in Accession #:    [id number]     Weight:       124.0 lb Date of Birth:  1960/09/13      BSA:          1.65 m Patient Age:    58 years       BP:           113/95 mmHg Patient Gender: F              HR:           60 bpm. Exam Location:  [facility] Procedure: 2D Echo, Cardiac Doppler and Color Doppler Indications:     Stroke 434.91  History:         Patient has no prior history of Echocardiogram examinations.                  Breast cancer, DVT.  Sonographer:  Sherrie Sport RDCS (AE) Referring Phys:  [id number] Athena Masse Diagnosing Phys: Neoma Laming MD  Sonographer Comments: Technically difficult study due to poor echo windows. Image acquisition challenging due to mastectomy and Left breast implant. IMPRESSIONS  1. Left ventricular ejection fraction, by visual estimation, is 60 to 65%. The left ventricle has normal function. There is borderline left ventricular hypertrophy.  2. Left ventricular diastolic function could not be evaluated.  3. The left ventricle has no regional wall motion abnormalities.  4. Global right ventricle was not assessed.The right ventricular size is not assessed. Right vetricular wall thickness was not assessed.  5. Left atrial size was not assessed.  6. Right atrial size was not assessed.  7. The pericardium was not assessed.  8. The mitral valve was not assessed. No evidence of mitral valve regurgitation.  9. The tricuspid valve is not assessed. Tricuspid valve regurgitation is not demonstrated. 10. The aortic  valve was not assessed. Aortic valve regurgitation is not visualized. 11. The pulmonic valve was not assessed. Pulmonic valve regurgitation is not visualized. 12. Aortic root could not be assessed. 13. The interatrial septum was not assessed. FINDINGS  Left Ventricle: Left ventricular ejection fraction, by visual estimation, is 60 to 65%. The left ventricle has normal function. The left ventricle has no regional wall motion abnormalities. There is borderline left ventricular hypertrophy. Left ventricular diastolic function could not be evaluated. Right Ventricle: The right ventricular size is not assessed. Right vetricular wall thickness was not assessed. Global RV systolic function is was not assessed. Left Atrium: Left atrial size was not assessed. Right Atrium: Right atrial size was not assessed Pericardium: The pericardium was not assessed. Mitral Valve: The mitral valve was not assessed. No evidence of mitral valve regurgitation. Tricuspid Valve: The tricuspid valve is not assessed. Tricuspid valve regurgitation is not demonstrated. Aortic Valve: The aortic valve was not assessed. Aortic valve regurgitation is not visualized. Pulmonic Valve: The pulmonic valve was not assessed. Pulmonic valve regurgitation is not visualized. Pulmonic regurgitation is not visualized. Aorta: Aortic root could not be assessed. IAS/Shunts: The interatrial septum was not assessed.  LEFT VENTRICLE PLAX 2D LVIDd:         3.88 cm LVIDs:         2.55 cm LV PW:         1.59 cm LV IVS:        1.14 cm LV SV:         42 ml LV SV Index:   25.66  LEFT ATRIUM         Index LA diam:    4.50 cm 2.73 cm/m  PULMONIC VALVE PV Vmax:        0.98 m/s PV Peak grad:   3.9 mmHg RVOT Peak grad: 7 mmHg   Neoma Laming MD Electronically signed by Neoma Laming MD Signature Date/Time: 03/18/2019/3:38:04 PM    Final     Medications:  I have reviewed the patient's current medications. Scheduled: .  stroke: mapping our early stages of recovery book   Does  not apply Once  . aspirin  300 mg Rectal Daily   Or  . aspirin EC  325 mg Oral Daily  . atorvastatin  10 mg Oral q1800  . DULoxetine  60 mg Oral QHS  . enoxaparin (LOVENOX) injection  40 mg Subcutaneous Q24H  . gabapentin  600 mg Oral TID  . montelukast  10 mg Oral Daily  . morphine  60 mg Oral Q12H  . pregabalin  200 mg Oral TID  . tiotropium  18 mcg Inhalation Daily  . traZODone  100 mg Oral QHS    Assessment/Plan: 58 y.o. female with a history of seizures (on Depakote, Lyrica and Neurontin), anxiety, depression, breast cancer, compartment syndrome of left lower extremity, DVT, sciatic nerve pain, fibromyalgia, PTSD, and obstructive sleep apnea presenting after being found confused.  This is likely representative of post-ictal phenomena.  Head CT with some concern for infarct due to hypodensity in the left temporal lobe.  Neurological examination is nonfocal.  Depakote level of 48 on admission.  Sharp activity seen on EEG.  One IV dose of Depakote given with increase in maintenance.  Today's level is 128.   Recommendations: 1. Hold AM Depakote.  Restart this evening at 500mg  and then increase to 750mg  BID in AM 2. Depakote level in AM 3. Seizure precautions 4. Patient unable to drive, operate heavy machinery, perform activities at heights and participate in water activities until release by outpatient physician. 5. Repeat head CT in AM     LOS: 1 day   Alexis Goodell, MD Neurology [phone] 03/19/2019  9:52 AM

## 2019-03-20 ENCOUNTER — Inpatient Hospital Stay: Payer: Medicare Other

## 2019-03-20 LAB — VALPROIC ACID LEVEL: Valproic Acid Lvl: 80 ug/mL (ref 50.0–100.0)

## 2019-03-20 MED ORDER — DIVALPROEX SODIUM 500 MG PO DR TAB
750.0000 mg | DELAYED_RELEASE_TABLET | Freq: Two times a day (BID) | ORAL | Status: DC
  Administered 2019-03-20: 13:00:00 750 mg via ORAL
  Filled 2019-03-20 (×2): qty 1

## 2019-03-20 MED ORDER — DIVALPROEX SODIUM 250 MG PO DR TAB: 750.0000 mg | DELAYED_RELEASE_TABLET | Freq: Two times a day (BID) | ORAL | 0 refills | Status: AC

## 2019-03-20 MED ORDER — ATORVASTATIN CALCIUM 10 MG PO TABS: 10.0000 mg | ORAL_TABLET | Freq: Every day | ORAL | 0 refills | Status: AC
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
## 2019-03-20 NOTE — TOC Transition Note (Signed)
Transition of Care [facility]) - CM/SW Discharge Note   Patient Details  Name: Rebecca Colon MRN: [id number] Date of Birth: 09/20/1960  [facility]) CM/SW Contact:  Shelbie Hutching, RN Phone Number: 03/20/2019, 1:43 PM   Clinical Narrative:    Patient will discharge home with home health services through [place].  Floydene Flock accepted referral and orders are in for [facility] RN, PT, OT, and aide.  Patient's neighbor Myna Hidalgo is coming to pick her up for discharge and take her home.  Patient reports that she lives alone, her daughter Mendel Ryder lives in [place].  Patient has a walker and cane at home, she reports she mostly uses the cane but will use the walker if she is feeling weak.  PCP verified as Pincus Large at [facility].  No other discharge needs identified at this time.    Final next level of care: Home w Home Health Services Barriers to Discharge: Barriers Resolved   Patient Goals and CMS Choice   CMS Medicare.gov Compare Post Acute Care list provided to:: Patient Choice offered to / list presented to : Patient  Discharge Placement                       Discharge Plan and Services                          HH Arranged: RN, PT, OT, Nurse's Aide [facility] Agency: [place] ([place]) Date [place]: 03/20/19 Time [place]: [id number] Representative spoke with at [place]: [place] (SDOH) Interventions     Readmission Risk Interventions No flowsheet data found.

## 2019-03-20 NOTE — Discharge Summary (Signed)
Physician Discharge Summary  Patient ID: HAYLEE RIRIE MRN: [id number] DOB/AGE: [birth year] 58 y.o.  Admit date: 03/17/2019 Discharge date: 03/20/2019  Admission Diagnoses: Acute metabolic encephalopathy   Discharge Diagnoses:  Principal Problem:   Acute metabolic encephalopathy Active Problems:   Depression   Chronic narcotic use   Seizure disorder (HCC)   COPD (chronic obstructive pulmonary disease) (HCC)   Polypharmacy   Altered mental status   Cerebral ischemia on CT head   Stroke [facility])   Discharged Condition: fair  Hospital Course:  58 y.o.femalewithHistory of seizure disorder, chronic pain on chronic narcotic therapy and with spinal stimulator, history of COPD, depression and hypothyroidism who was brought into the emergency room after she was found sitting in her car. She was apparently sitting there for over 24 hours per report given to EMS. She arrived in the emergency room she was reportedly confused and disoriented. She complained of knee pain and pains at the bottom of her heels she was noted to have friction blisters. States she had not taken his seizure medicine in over a month. Said she was in her usual state of health. Said that she was just very sleepy. Unknown whether she had a seizure. No stigmata of seizure, no bitten tongue no urinary incontinence. In the emergency room, vitals were within normal limits. Valproate level was slightly decreased at 48. Her blood work was mostly unremarkable. Urinalysis was negative. CT head was significant for findings of acute ischemia with recommendation for MRI. Patient could not get MRI because of a spinal cord stimulator.  Acute metabolic encephalopathy:  Resolved -Suspected to have post ictal phenomena --Appreciate neurology recommendation --MRI was recommended however and has a spinal stimulator and unable to get MRI --Repeat CT scan of head contrast on 03/20/2019 shows no acute findings again -Patient was  given IV Depakote in hospital and discharged with Depakote 750 mg p.o. twice daily per neurology -Patient is to follow-up with her neurology in 2 to 4 weeks preferably sooner.  Patient has a neurologist and she has an appointment coming up as she mentioned. -Patient received physical therapy.  And patient was sent home with home health physical therapy  Depression: No acute issues.  Denies any suicidal ideas or thoughts and also denies any homicidal thoughts or ideas. ---Continue home medication -Low up with PCP  Chronic back pain with chronic narcotic use ---Patient has a spinal stimulator --Continue to hold narcotics for now -Gabapentin and Lyrica home medication to be continued but at a reduced dose recommended as patient becomes more sleepy and drowsy -Follow-up with PCP for medication adjustment or dose adjustment  Possible acute seizure with history of seizure disorder ([place]) --Valproic acid level slightly as mentioned above --- EEG done on 03/18/2019 did not show any electrographic seizure -Discharged with valproic acid 750 mg p.o. twice daily  Blisters bilateral heels -No acute issues or infections --Has friction blisters on her heels --Heels elevated -Mepilex on -Follow-up PCP  COPD (chronic obstructive pulmonary disease) (HCC)-no acute issue --Continue home bronchodilator therapy. --Not acutely exacerbated  Polypharmacy --Judicious use of home psychotropics -Some medications recommended to stop or take at a reduced dose -Further adjustment per patient's PCP and neurology  Consults: neurology  Significant Diagnostic Studies: CT MRI of the brain and other imagings  Treatments: Per hospital course and discharge med list  Discharge Exam: Blood pressure (!) 115/58, pulse (!) 52, temperature 98.1 F (36.7 C), resp. rate 17, height 5\' 7"  (1.702 m), weight 56.2 kg, SpO2 99 %.  General: Much alert and oriented Eyes:PERRL, lids and conjunctivae  normal ENMT:Mucous membranes are moist. Posterior pharynx clear of any exudate or lesions.Normal dentition.  Respiratory:clear to auscultation bilaterally, no wheezing, no crackles. Normal respiratory effort. No accessory muscle use.  Cardiovascular:Regular rate and rhythm, no murmurs / rubs / gallops. No extremity edema. 2+ pedal pulses. No carotid bruits.  Abdomen:no tenderness, no masses palpated. No hepatosplenomegaly. Bowel sounds positive.  Musculoskeletal:no clubbing / cyanosis. No joint deformity upper and lower extremities. Good ROM, no contractures. Normal muscle tone.  Skin:friction blisters bilateral heels; Mepilex is on neurologic:CN 2-12 grossly intact. Sensation intact, DTR normal. Strength 5/5 in all 4.  Psychiatric:Normal judgment and insight. 58 and oriented x 3. Normal mood.  Denies suicidal idea or homicidal idea or suicidal thoughts or homicidal thoughts  Disposition: Discharge disposition: 06-Home-Health Care Svc     Patient was deemed stable to be discharged home with above-mentioned discharge medication and instructions.  Strongly advised to follow-up with her PCP and neurologist in 1 to 2 weeks. Signs or symptoms when she must seek immediate medical attention explained to patient's.  And patient expressed understanding. Discharge Instructions    Call MD for:  difficulty breathing, headache or visual disturbances   Complete by: As directed    Call MD for:  persistant dizziness or light-headedness   Complete by: As directed    Diet - low sodium heart healthy   Complete by: As directed    Increase activity slowly   Complete by: As directed    Per PT/OT     Allergies as of 03/20/2019      Reactions   Other Hives   Significant skin reaction to Vicryl suture   Penicillins Anaphylaxis, Other (See Comments)   **Patient received ceftriaxone 1g IV in ED on 07/08/17 w/o ADR Has patient had a PCN reaction causing immediate rash, facial/tongue/throat swelling,  SOB or lightheadedness with hypotension: Yes Has patient had a PCN reaction causing severe rash involving mucus membranes or skin necrosis: Unknown Has patient had a PCN reaction that required hospitalization: Yes Has patient had a PCN reaction occurring within the last 10 years: No If all above answers are "NO", then may proceed with Cephalosporin use.   Tape Rash      Medication List    STOP taking these medications   gabapentin 600 MG tablet Commonly known as: NEURONTIN   traZODone 50 MG tablet Commonly known as: DESYREL     TAKE these medications   albuterol 108 (90 Base) MCG/ACT inhaler Commonly known as: VENTOLIN HFA Inhale 2 puffs into the lungs every 6 (six) hours as needed for wheezing or shortness of breath.   atorvastatin 10 MG tablet Commonly known as: LIPITOR Take 1 tablet (10 mg total) by mouth daily at 6 PM.   baclofen 20 MG tablet Commonly known as: LIORESAL Take 20 mg by mouth every 8 (eight) hours as needed for muscle spasms.   divalproex 250 MG DR tablet Commonly known as: Depakote Take 3 tablets (750 mg total) by mouth 2 (two) times daily. What changed:   medication strength  how much to take  when to take this   DULoxetine 60 MG capsule Commonly known as: CYMBALTA Take 60 mg by mouth at bedtime.   escitalopram 20 MG tablet Commonly known as: LEXAPRO Take 20 mg by mouth daily.   hydrOXYzine 25 MG capsule Commonly known as: VISTARIL Take 25 mg by mouth 3 (three) times daily as needed for anxiety.   montelukast 10  MG tablet Commonly known as: SINGULAIR Take 10 mg by mouth daily.   morphine 30 MG tablet Commonly known as: MSIR Take 30 mg by mouth 2 (two) times daily as needed for pain.   morphine 60 MG 12 hr tablet Commonly known as: MS CONTIN Take 60 mg by mouth every 12 (twelve) hours.   Movantik 25 MG Tabs tablet Generic drug: naloxegol oxalate Take 25 mg by mouth daily as needed (constipation).   pregabalin 200 MG  capsule Commonly known as: LYRICA Take 200 mg by mouth 3 (three) times daily.   tiotropium 18 MCG inhalation capsule Commonly known as: SPIRIVA Place 18 mcg into inhaler and inhale daily.        Signed: Thornell Mule 03/20/2019, 3:47 PM

## 2019-03-20 NOTE — Progress Notes (Signed)
MD order received in [facility] to discharge pt home with home health today; [facility] team previously established Ashley, NT, PT and OT with [place]; verbally reviewed AVS with pt, no questions voiced at this time; pt discharged via wheelchair by nursing to the [place] entrance

## 2019-03-20 NOTE — Plan of Care (Signed)
  Problem: Education: Goal: Knowledge of secondary prevention will improve Outcome: Adequate for Discharge   Problem: Health Behavior/Discharge Planning: Goal: Ability to manage health-related needs will improve Outcome: Adequate for Discharge   Problem: Activity: Goal: Risk for activity intolerance will decrease Outcome: Adequate for Discharge   Problem: Coping: Goal: Level of anxiety will decrease Outcome: Adequate for Discharge

## 2019-03-20 NOTE — Plan of Care (Signed)
Pt d/ced to home.  Reviewed d/c instructions, f/u appts and medications.  IV removed.

## 2019-03-20 NOTE — Progress Notes (Signed)
Subjective: Patient feels she is at baseline.  No seizures noted.    Objective: Current vital signs: BP (!) 115/58 (BP Location: Right Arm)   Pulse (!) 52   Temp 98.1 F (36.7 C)   Resp 17   Ht 5\' 7"  (1.702 m)   Wt 56.2 kg   SpO2 99%   BMI 19.42 kg/m  Vital signs in last 24 hours: Temp:  [97.8 F (36.6 C)-98.3 F (36.8 C)] 98.1 F (36.7 C) (12/17 0744) Pulse Rate:  [52-69] 52 (12/17 0744) Resp:  [16-17] 17 (12/17 0744) BP: (93-119)/(52-86) 115/58 (12/17 0744) SpO2:  [95 %-99 %] 99 % (12/17 0744)  Intake/Output from previous day: 12/16 0701 - 12/17 0700 In: 611.5 [P.O.:120; I.V.:491.5] Out: -  Intake/Output this shift: No intake/output data recorded. Nutritional status:  Diet Order            Diet 2 gram sodium Room service appropriate? Yes; Fluid consistency: Thin  Diet effective now              Neurologic Exam: Mental Status: Awake and alert. Speech fluent without evidence of aphasia. Able to follow 3 step commands without difficulty. Cranial Nerves: II: Discs flat bilaterally; Visual fields grossly normal, pupils equal, round, reactive to light and accommodation III,IV, VI: ptosis not present, extra-ocular motions intact bilaterally V,VII: smile symmetric, facial light touch sensation normal bilaterally VIII: hearing normal bilaterally IX,X: gag reflex present XI: bilateral shoulder shrug XII: midline tongue extension Motor: 5/5 throughout Sensory: Pinprick and light touch intact throughout, bilaterally   Lab Results: Basic Metabolic Panel: Recent Labs  Lab 03/17/19 2114  NA 139  K 3.4*  CL 101  CO2 25  GLUCOSE 122*  BUN 17  CREATININE 0.62  CALCIUM 9.4    Liver Function Tests: Recent Labs  Lab 03/17/19 2114  AST 28  ALT 16  ALKPHOS 51  BILITOT 1.3*  PROT 7.5  ALBUMIN 4.1   No results for input(s): LIPASE, AMYLASE in the last 168 hours. No results for input(s): AMMONIA in the last 168 hours.  CBC: Recent Labs  Lab  03/17/19 2114 03/18/19 0042  WBC 8.9 10.5  HGB 15.0 14.2  HCT 42.8 39.7  MCV 90.7 90.0  PLT 204 184    Cardiac Enzymes: Recent Labs  Lab 03/18/19 0954  CKTOTAL 453*    Lipid Panel: Recent Labs  Lab 03/18/19 0954  CHOL 177  TRIG 101  HDL 45  CHOLHDL 3.9  VLDL 20  LDLCALC 112*    CBG: No results for input(s): GLUCAP in the last 168 hours.  Microbiology: Results for orders placed or performed during the hospital encounter of 03/17/19  SARS CORONAVIRUS 2 (TAT 6-24 HRS) Nasopharyngeal Nasopharyngeal Swab     Status: None   Collection Time: 03/18/19 12:42 AM   Specimen: Nasopharyngeal Swab  Result Value Ref Range Status   SARS Coronavirus 2 NEGATIVE NEGATIVE Final    Comment: (NOTE) SARS-CoV-2 target nucleic acids are NOT DETECTED. The SARS-CoV-2 RNA is generally detectable in upper and lower respiratory specimens during the acute phase of infection. Negative results do not preclude SARS-CoV-2 infection, do not rule out co-infections with other pathogens, and should not be used as the sole basis for treatment or other patient management decisions. Negative results must be combined with clinical observations, patient history, and epidemiological information. The expected result is Negative. Fact Sheet for Patients: SugarRoll.be Fact Sheet for Healthcare Providers: https://www.woods-mathews.com/ This test is not yet approved or cleared by the [place] FDA  and  has been authorized for detection and/or diagnosis of SARS-CoV-2 by FDA under an Emergency Use Authorization (EUA). This EUA will remain  in effect (meaning this test can be used) for the duration of the COVID-19 declaration under Section 56 4(b)(1) of the Act, 21 U.S.C. section 360bbb-3(b)(1), unless the authorization is terminated or revoked sooner. Performed at [facility], [place] [address] [place], [place] [address]     Coagulation Studies: No  results for input(s): LABPROT, INR in the last 72 hours.  Imaging: EEG  Result Date: 03/18/2019 Alexis Goodell, MD     03/18/2019  5:57 PM ELECTROENCEPHALOGRAM REPORT Patient: Rebecca Colon       Room #: [id number] EEG No. ID: [id number] Age: 58 y.o.        Sex: female Referring Physician: Mal Misty Report Date:  03/18/2019       Interpreting Physician: Alexis Goodell History: Rebecca Colon is an 58 y.o. female with altered mental status Medications: Lipitor, Depakote, Cymbalta, Neurontin, MS Contin, Lyrica, Spiriva, Trazadone Conditions of Recording:  This is a 21 channel routine scalp EEG performed with bipolar and monopolar montages arranged in accordance to the international 10/20 system of electrode placement. One channel was dedicated to EKG recording. The patient is in the altered state. Description:  The background activity is slow and poorly organized.  It consists of a low to moderate voltage mixture of theta and delta activity with a maximum posterior background rhythm of 6Hz .  There are intermittent periods when the background slows even further and bifrontal sharp waves are noted. Hyperventilation was not performed.   Intermittent photic stimulation was performed but failed to illicit any change in the tracing. IMPRESSION: This is an abnormal electroencephalogram secondary to general background slowing with intermittent periods of bifrontal sharp waves consistent with the patient's history of seizures.  No electrographic seizures are noted. Alexis Goodell, MD Neurology [phone] 03/18/2019, 5:51 PM   CT HEAD WO CONTRAST  Result Date: 03/20/2019 CLINICAL DATA:  Stroke, follow-up EXAM: CT HEAD WITHOUT CONTRAST TECHNIQUE: Contiguous axial images were obtained from the base of the skull through the vertex without intravenous contrast. COMPARISON:  March 17, 2019 FINDINGS: Brain: There is no acute intracranial hemorrhage, mass-effect, or edema. There is no new loss of gray-white differentiation  to suggest evolving infarction. There is no extra-axial fluid collection. Ventricles and sulci are within normal limits in size and configuration. Vascular: No hyperdense vessel. Skull: Calvarium is unremarkable. Sinuses/Orbits: No acute finding. Other: None. IMPRESSION: No evidence of evolving recent infarction.  No new findings. Electronically Signed   By: Macy Mis M.D.   On: 03/20/2019 07:59   US Carotid Bilateral (at [facility] and [facility] only)  Result Date: 03/18/2019 CLINICAL DATA:  TIA.  Syncopal episode.  Former smoker. EXAM: BILATERAL CAROTID DUPLEX ULTRASOUND TECHNIQUE: Pearline Cables scale imaging, color Doppler and duplex ultrasound were performed of bilateral carotid and vertebral arteries in the neck. COMPARISON:  None. FINDINGS: Criteria: Quantification of carotid stenosis is based on velocity parameters that correlate the residual internal carotid diameter with NASCET-based stenosis levels, using the diameter of the distal internal carotid lumen as the denominator for stenosis measurement. The following velocity measurements were obtained: RIGHT ICA: 47/9 cm/sec CCA: 0000000 cm/sec SYSTOLIC ICA/CCA RATIO:  0.7 ECA: 73 cm/sec LEFT ICA: 38/8 cm/sec CCA: 123456 cm/sec SYSTOLIC ICA/CCA RATIO:  0.4 ECA: 62 cm/sec RIGHT CAROTID ARTERY: There is no grayscale evidence of significant intimal thickening or atherosclerotic plaque affecting the interrogated portions of the right  carotid system. There are no elevated peak systolic velocities within the interrogated course of the right internal carotid artery to suggest a hemodynamically significant stenosis. RIGHT VERTEBRAL ARTERY:  Antegrade flow LEFT CAROTID ARTERY: There is no grayscale evidence of significant intimal thickening or atherosclerotic plaque affecting the interrogated portions of the left carotid system. There are no elevated peak systolic velocities within the interrogated course of the left internal carotid artery to suggest a hemodynamically significant  stenosis. LEFT VERTEBRAL ARTERY:  Antegrade flow IMPRESSION: Normal carotid Doppler ultrasound. Electronically Signed   By: Sandi Mariscal M.D.   On: 03/18/2019 09:46    Medications:  I have reviewed the patient's current medications. Scheduled: .  stroke: mapping our early stages of recovery book   Does not apply Once  . aspirin  300 mg Rectal Daily   Or  . aspirin EC  325 mg Oral Daily  . atorvastatin  10 mg Oral q1800  . DULoxetine  60 mg Oral QHS  . enoxaparin (LOVENOX) injection  40 mg Subcutaneous Q24H  . gabapentin  600 mg Oral TID  . montelukast  10 mg Oral Daily  . morphine  60 mg Oral Q12H  . pregabalin  200 mg Oral TID  . tiotropium  18 mcg Inhalation Daily  . traZODone  100 mg Oral QHS    Assessment/Plan: 58 y.o.femalewith a history of seizures(on Depakote,Lyrica and Neurontin),anxiety, depression, breast cancer, compartment syndrome of left lower extremity, DVT, sciatic nerve pain, fibromyalgia, PTSD, and obstructive sleep apneapresenting after being found confused. This is likely representative of post-ictal phenomena. Head CT with some concern for infarct due to hypodensity in the left temporal lobe. Repeat CT does not shows evolution of infarct.  Do not suspect acute infarct leading to hospitalization.  Neurological examination is nonfocal. Depakote level of 48 on admission. Sharp activity seen on EEG.  One IV dose of Depakote given with increase in maintenance.  Today's level is 80.  Now on maintenance of 750mg  BID.  Recommendations: 1. Continue Depakote at 750mg  BID 2. Follow up with neurology on an outpatient basis.   3. Patient unable to drive, operate heavy machinery, perform activities at heights and participate in water activities until release by outpatient physician.   LOS: 2 days   Alexis Goodell, MD Neurology [phone] 03/20/2019  9:24 AM

## 2019-03-20 NOTE — Progress Notes (Signed)
Physical Therapy Treatment Patient Details Name: Rebecca Colon MRN: [id number] DOB: 10-Oct-1960 Today's Date: 03/20/2019    History of Present Illness 57 y.o. female with History of seizure disorder, chronic pain on chronic narcotic therapy and with spinal stimulator, history of COPD, depression and hypothyroidism who was brought into the emergency room after she was found sitting in her car (apparently over 24 hours).  She arrived in the emergency room confused and disoriented, no obvious sign of seizure, has b/l heel friction blisters.  Head CT indicates possible CVA, unable to have MRI secondary to stimulator. States she had not taken his seizure medicine in over a month.    PT Comments    Pt is making good progress towards goals with ability to improve gait distance and navigate stairs necessary to enter/exit home. Pt still with improved balance using RW vs SPC. Encouraged to continue use of RW at home for safety. Able to perform ADL activities in room with cga. Pt eager to dc home this date.   Follow Up Recommendations  Home health PT;Supervision - Intermittent     Equipment Recommendations  None recommended by PT    Recommendations for Other Services       Precautions / Restrictions Precautions Precautions: Fall Restrictions Weight Bearing Restrictions: No    Mobility  Bed Mobility Overal bed mobility: Independent             General bed mobility comments: safe technique, no assist required. Cues for sitting up at EOB for a few minutes for self assessment of balance  Transfers Overall transfer level: Independent Equipment used: None Transfers: Sit to/from Stand Sit to Stand: Independent         General transfer comment: safe technique without cues  Ambulation/Gait Ambulation/Gait assistance: Min guard Gait Distance (Feet): 500 Feet Assistive device: Rolling walker (2 wheeled) Gait Pattern/deviations: Step-through pattern     General Gait Details:  ambulated to ortho gym, cleared to leave unit via RN. Pt with improved ability to carry conversation during ambulation without LOB. No cues for correct RW used. Slow gait speed with slight unsteadiness noted.   Stairs Stairs: Yes Stairs assistance: Min guard Stair Management: Two rails;Step to pattern Number of Stairs: 4 General stair comments: navigated stairs with safe technique using step to pattern. Therapist demonstrated technique prior to performance   Wheelchair Mobility    Modified Rankin (Stroke Patients Only)       Balance Overall balance assessment: Mild deficits observed, not formally tested                                          Cognition Arousal/Alertness: Awake/alert Behavior During Therapy: WFL for tasks assessed/performed Overall Cognitive Status: Within Functional Limits for tasks assessed                                        Exercises Other Exercises Other Exercises: further ambulation performed x 10' with use of SPC. High guard noted with decreased step length. Decreased safety noted with [facility] Other Exercises: ambualted to bathroom with cga. needs cga for safety during turns and transfer on/off toliet. Able to perform self hygiene safely. Also able to stand at sink with 1 hand support to brush teeth.    General Comments  Pertinent Vitals/Pain Pain Assessment: No/denies pain    Home Living                      Prior Function            PT Goals (current goals can now be found in the care plan section) Acute Rehab PT Goals Patient Stated Goal: be safe at home PT Goal Formulation: With patient Time For Goal Achievement: 04/01/19 Potential to Achieve Goals: Good Progress towards PT goals: Progressing toward goals    Frequency    Min 2X/week      PT Plan Current plan remains appropriate    Co-evaluation              AM-PAC PT "6 Clicks" Mobility   Outcome Measure  Help needed  turning from your back to your side while in a flat bed without using bedrails?: None Help needed moving from lying on your back to sitting on the side of a flat bed without using bedrails?: None Help needed moving to and from a bed to a chair (including a wheelchair)?: None Help needed standing up from a chair using your arms (e.g., wheelchair or bedside chair)?: None Help needed to walk in hospital room?: A Little Help needed climbing 3-5 steps with a railing? : A Little 6 Click Score: 22    End of Session Equipment Utilized During Treatment: Gait belt Activity Tolerance: Patient tolerated treatment well Patient left: in bed(sitting at EOB with chaplin) Nurse Communication: Mobility status PT Visit Diagnosis: Muscle weakness (generalized) (M62.81);Difficulty in walking, not elsewhere classified (R26.2)     Time: [id number] PT Time Calculation (min) (ACUTE ONLY): 30 min  Charges:  $Gait Training: 8-22 mins $Therapeutic Exercise: 8-22 mins                     Greggory Stallion, PT, DPT [phone]    Ronalee Scheunemann 03/20/2019, 10:06 AM

## 2019-03-20 NOTE — Discharge Instructions (Signed)
[facility] RN, Physical Therapy, Occupation Therapy and English as a second language teacher.

## 2019-04-05 IMAGING — US US BREAST*R* LIMITED INC AXILLA
1 series · 13 of 19 positions shown · non-contrast
Comparison: None available.

CLINICAL DATA: Clinical diagnosis of right axillary and right
breast cellulitis with a suspicion for abscess formation. Patient is
post bilateral mastectomy and implant reconstruction for right
breast DCIS in 0184.

EXAM:
ULTRASOUND OF THE RIGHT BREAST

[Series 1: us breast*right* limited inc axilla · 0.06mm/px · 13 of 19 slices shown]
[im 1/19]
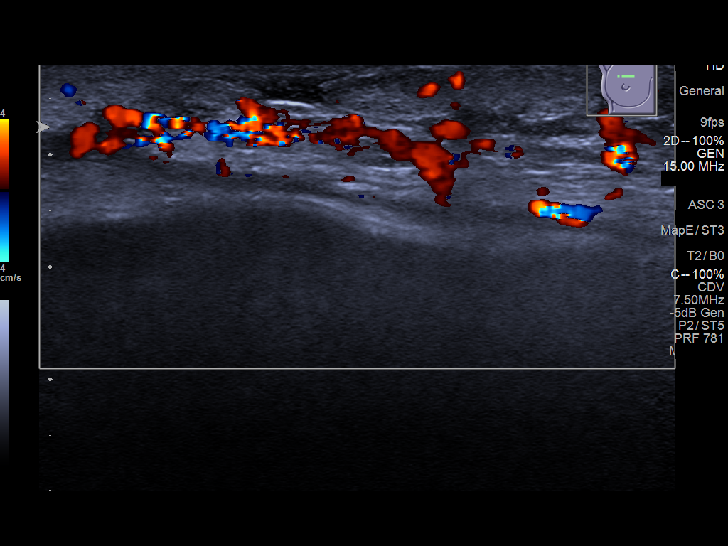
[im 3/19]
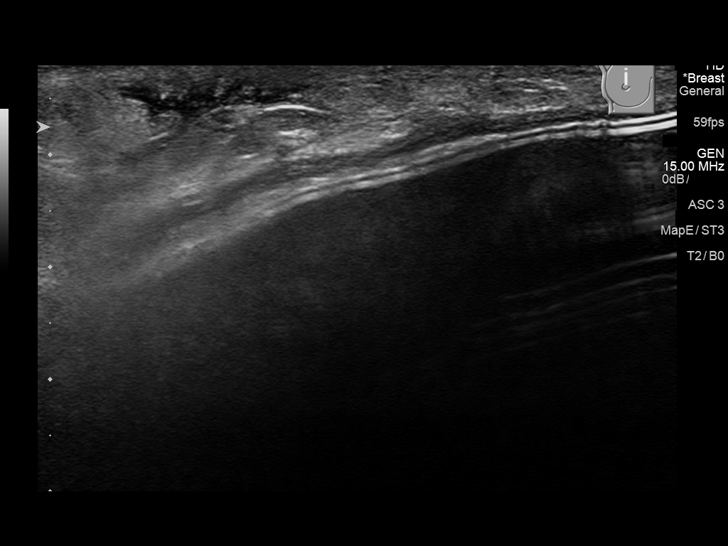
[im 4/19]
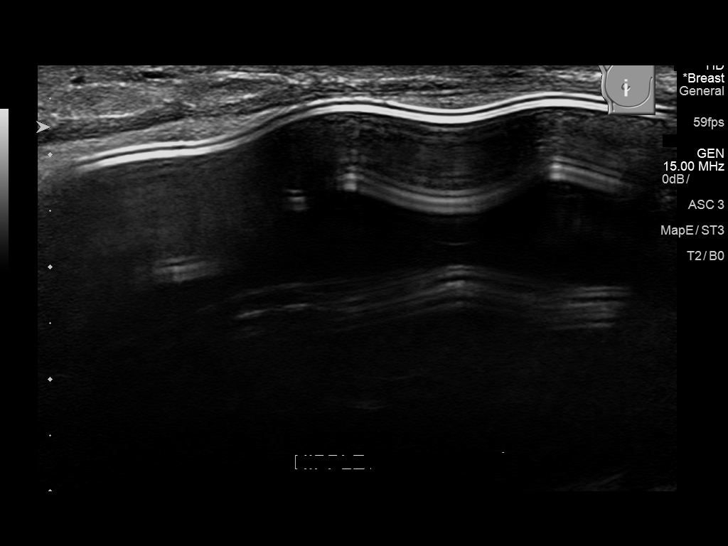
[im 6/19]
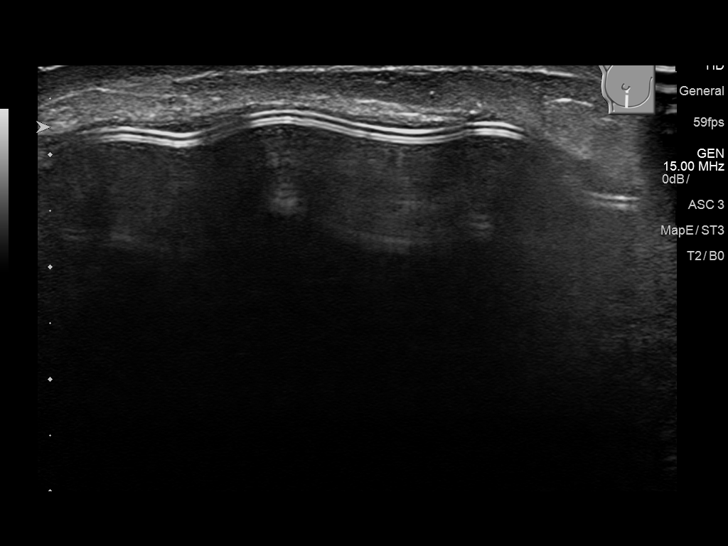
[im 7/19]
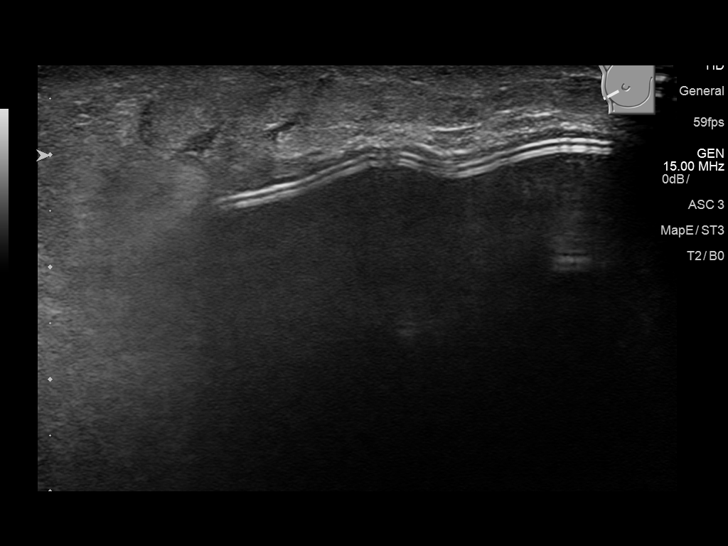
[im 9/19]
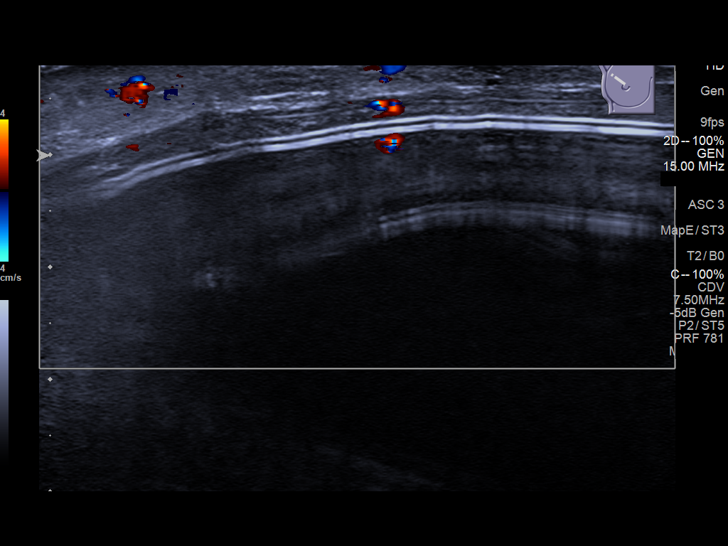
[im 10/19]
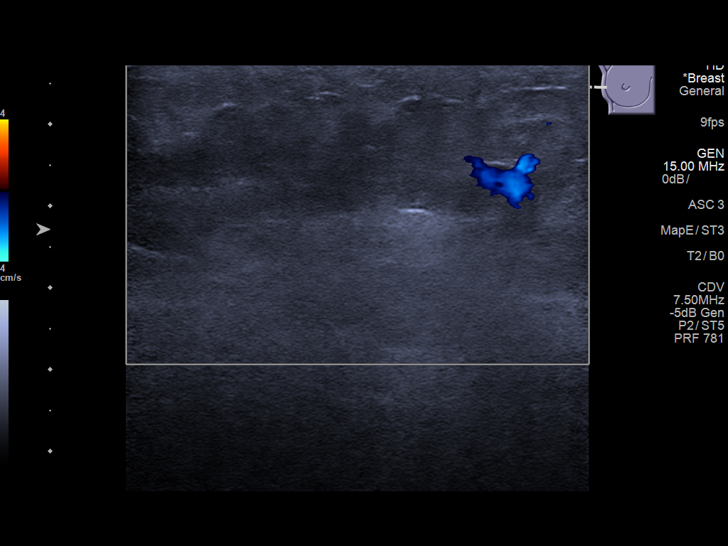
[im 11/19]
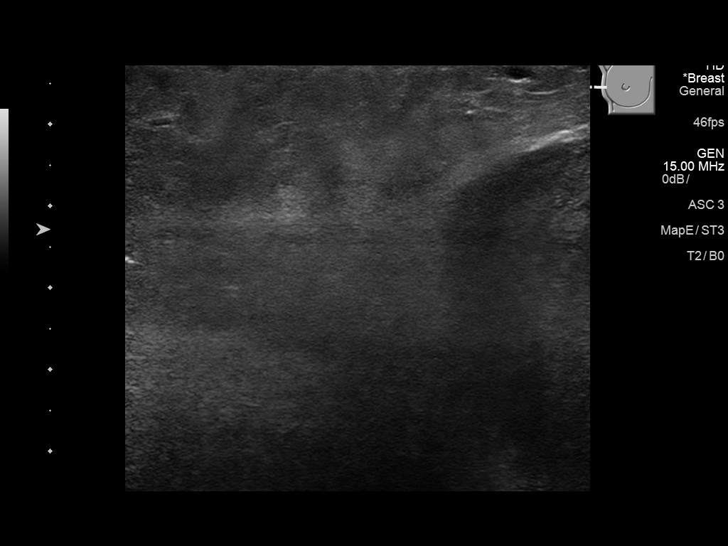
[im 13/19]
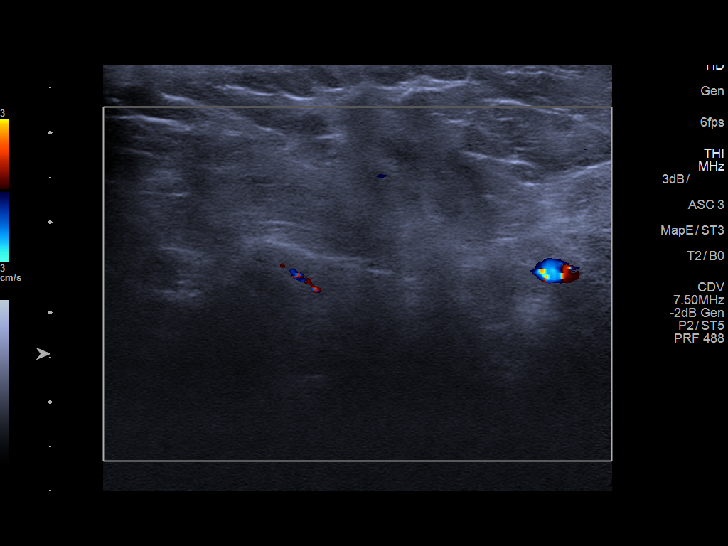
[im 14/19]
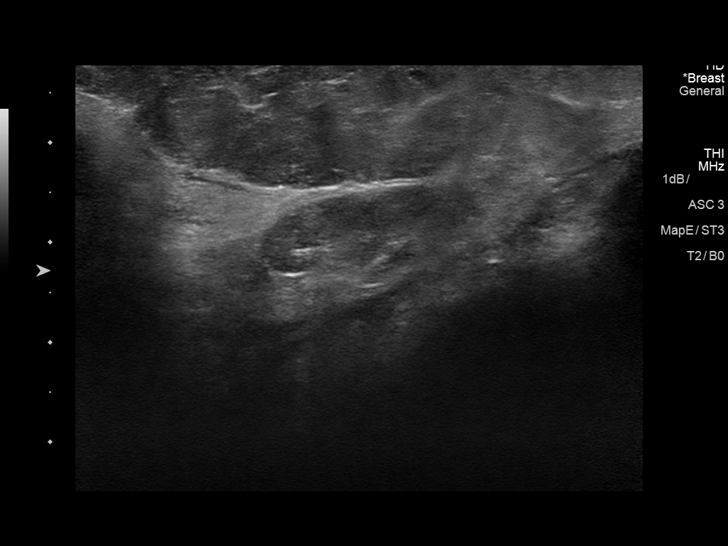
[im 16/19]
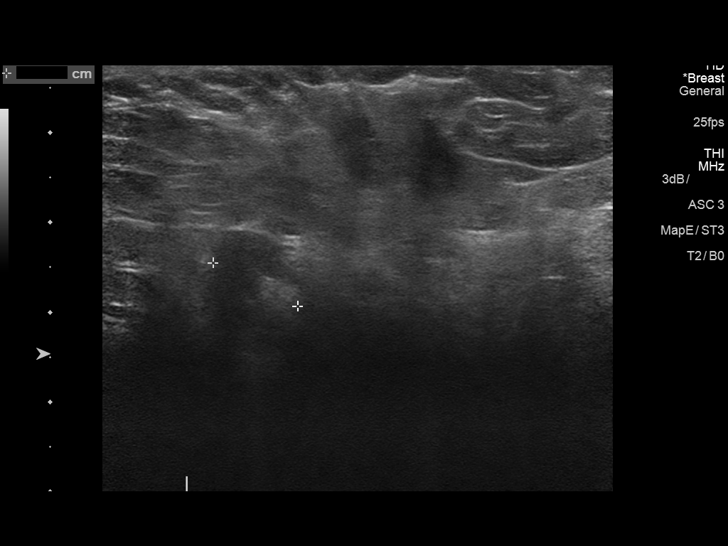
[im 17/19]
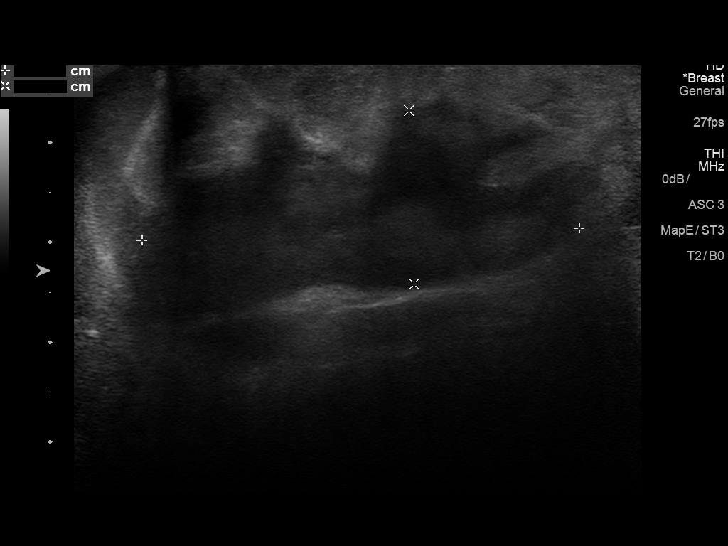
[im 19/19]
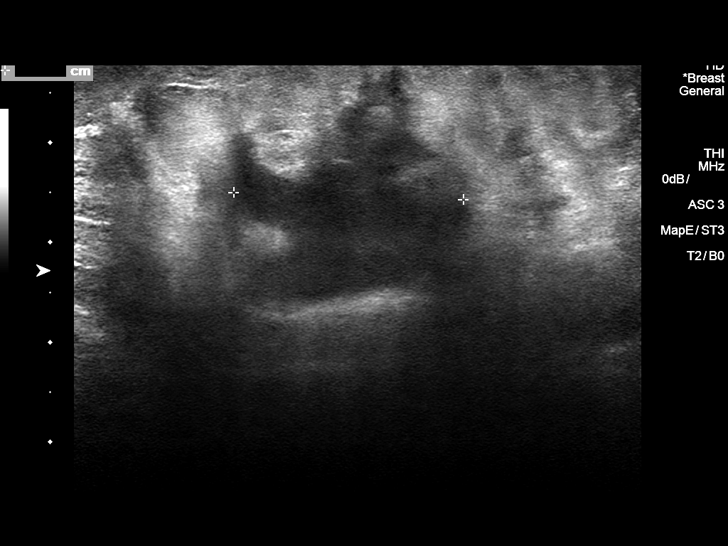

[13 of 19 positions shown; findings below may reference images not displayed]

Patient's prior exams were performed at
the [facility] and were not available at the time of this
dictation.
FINDINGS: On physical exam, there is diffuse edema and erythema of the right
reconstructed breast extending well into the axilla and right
lateral chest wall. There is firmness on exam of the right lateral
chest wall.

Targeted ultrasound is performed, showing diffuse edema, more
pronounced laterally within the reconstructed breast, within the
right lateral chest wall and right axilla. There is a complex fluid
collection within the right axilla which measures 4.4 x 1.7 x
cm. Probably reactive right axillary lymph nodes with symmetric
cortical thickening of 4 mm are seen. There is a sonographically
intact subpectoral right breast implant.
IMPRESSION: Diffuse right neo breast/right lateral chest wall/right axilla
cellulitis with a localized fluid collection within the axilla
extending to the lateral chest wall, which likely represents an
abscess.

In correlation with the recently performed chest CT, there is a
larger fluid collection posterior to the retropectoral implant,
which was not seen by ultrasound, as the ultrasound cannot penetrate
deep to the implant. Given this, surgical consultation for treatment
of this widespread cellulitis and probable abscess is recommended.

These results were called by telephone at the time of interpretation
on 11/12/2017 at [DATE] to Dr. TAVO BATTISTE , who verbally acknowledged
these results.

RECOMMENDATION:
Surgical consultation for treatment of right chest wall/neo
breast/axilla cellulitis and abscess.

I have discussed the findings and recommendations with the patient.
Results were also provided in writing at the conclusion of the
visit. If applicable, a reminder letter will be sent to the patient
regarding the next appointment.

BI-RADS CATEGORY  2: Benign.

## 2021-02-12 IMAGING — CR DG KNEE COMPLETE 4+V*R*
4 series · 4 of 4 positions shown · non-contrast
Comparison: None.

CLINICAL DATA: Bilateral knee pain status post multiple falls.

EXAM:
RIGHT KNEE - COMPLETE 4+ VIEW; LEFT KNEE - COMPLETE 4+ VIEW

[knee ap (1 of 2)]
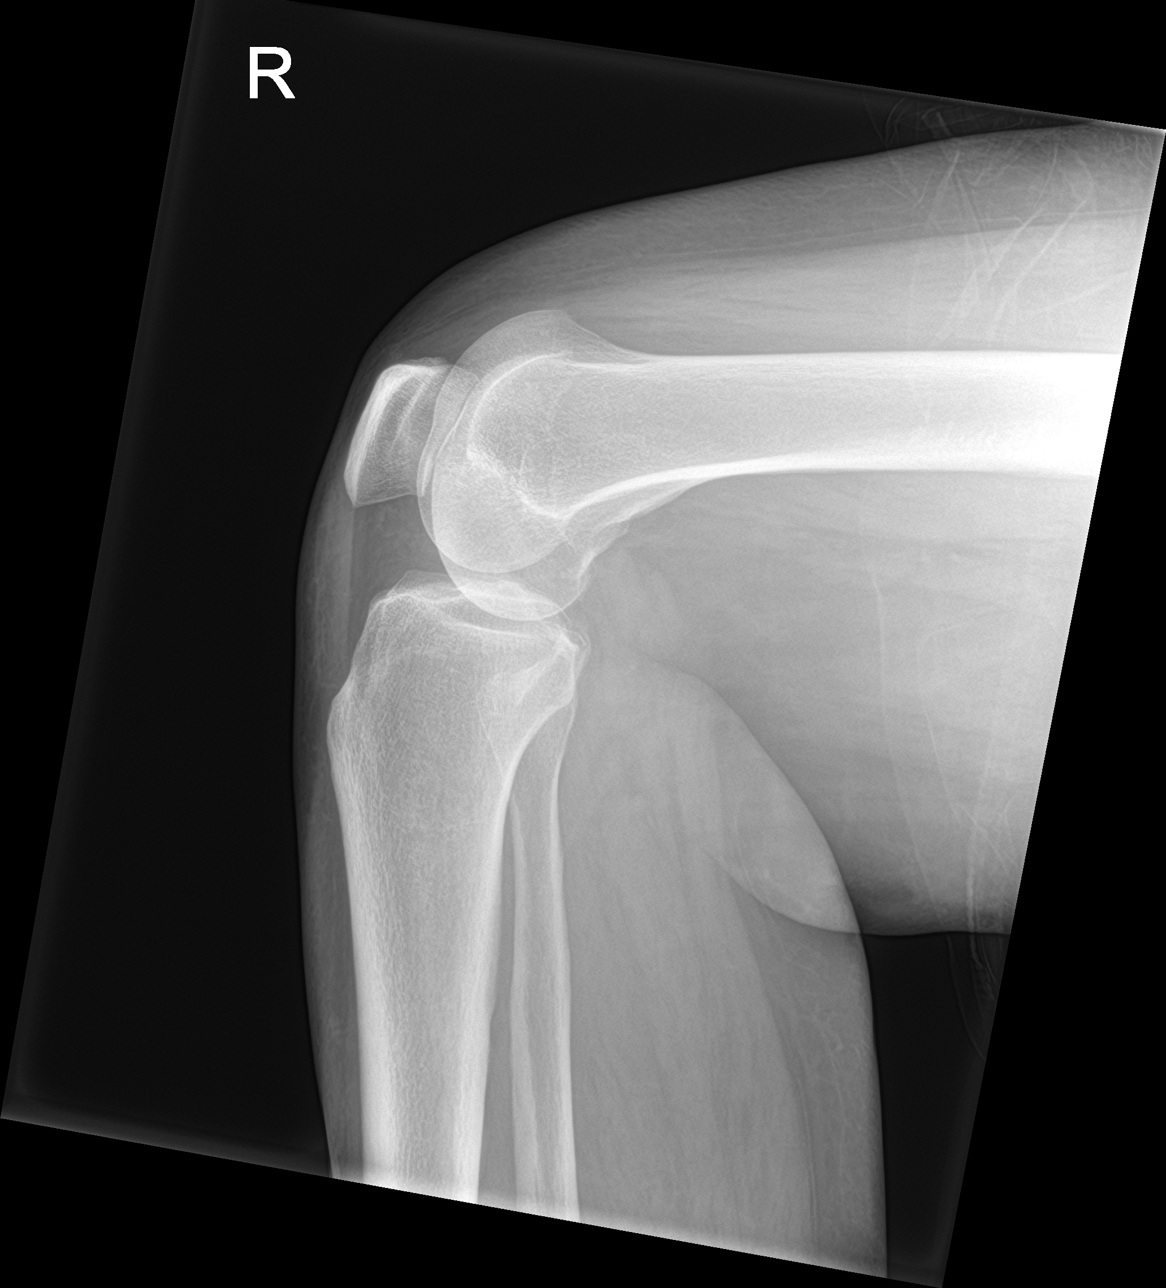

[knee obl (1 of 2)]
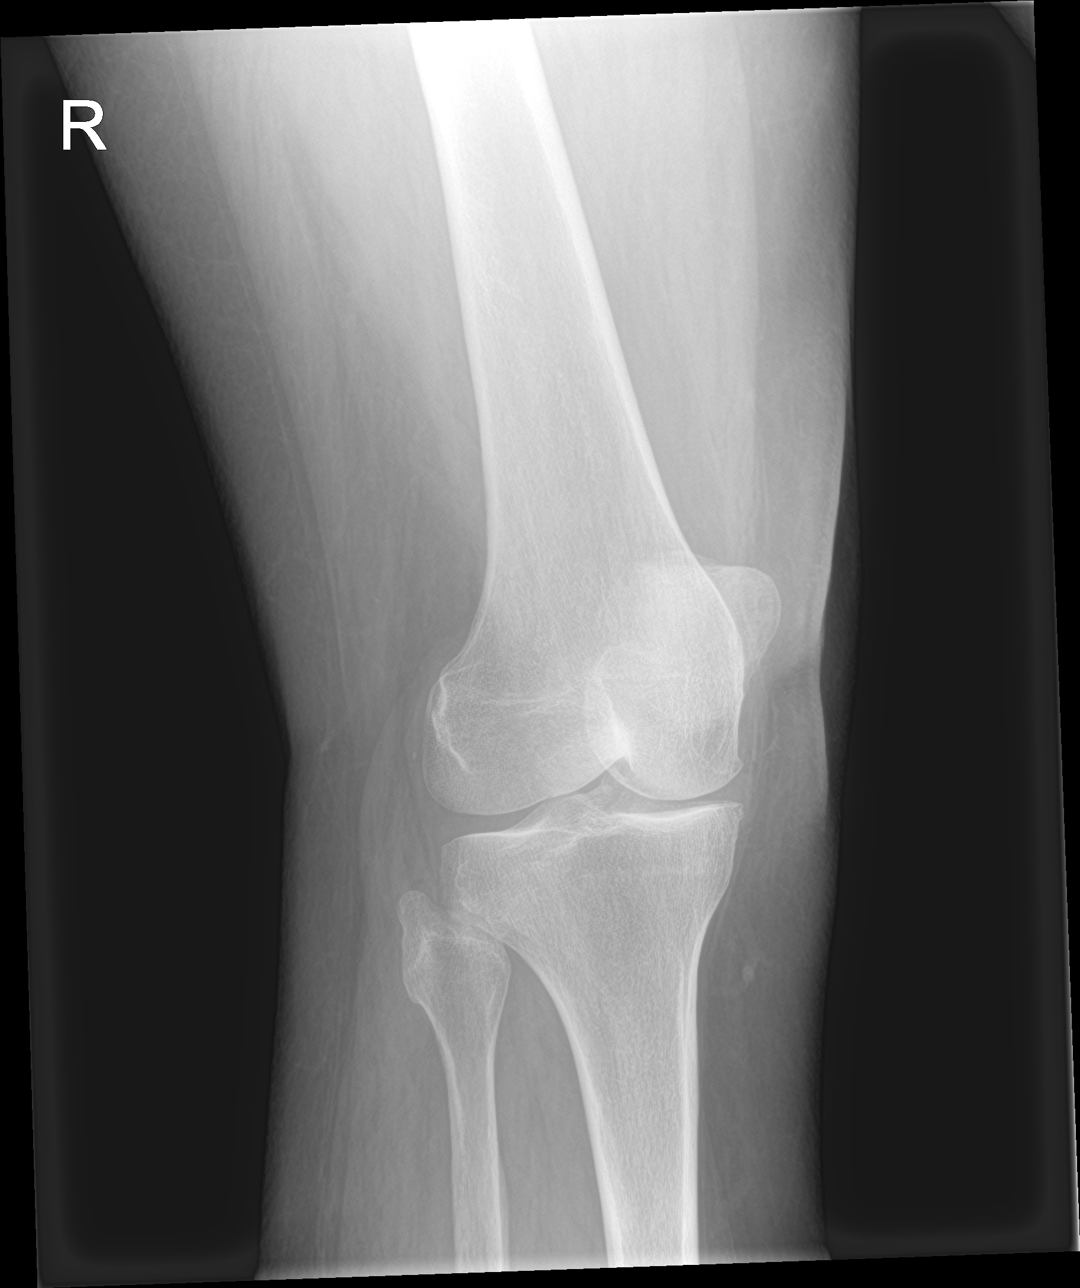

[knee obl (2 of 2)]
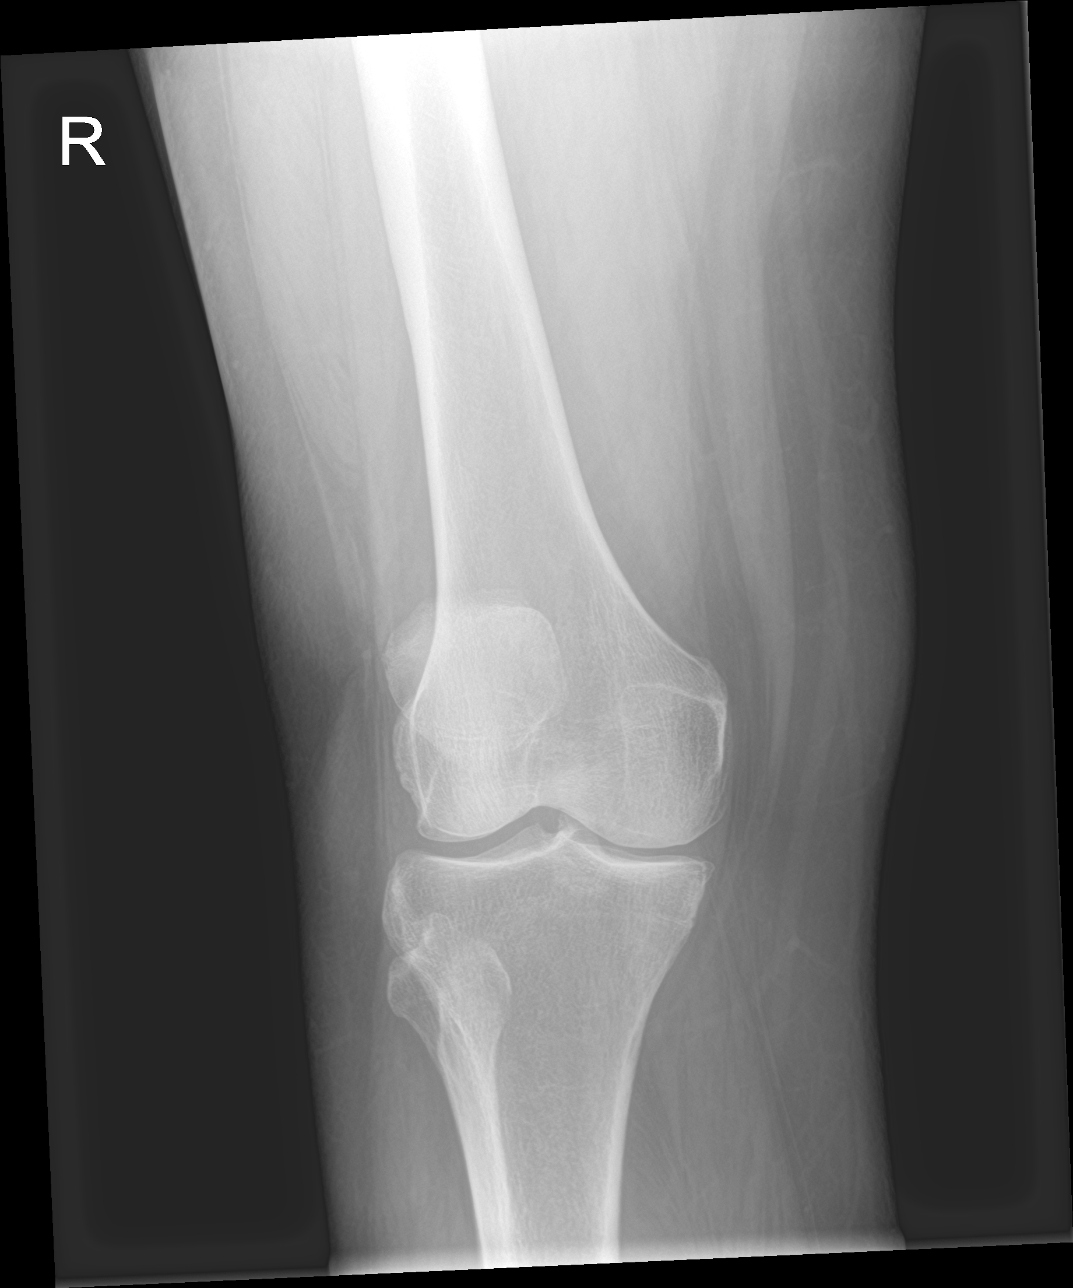

[knee ap (2 of 2)]
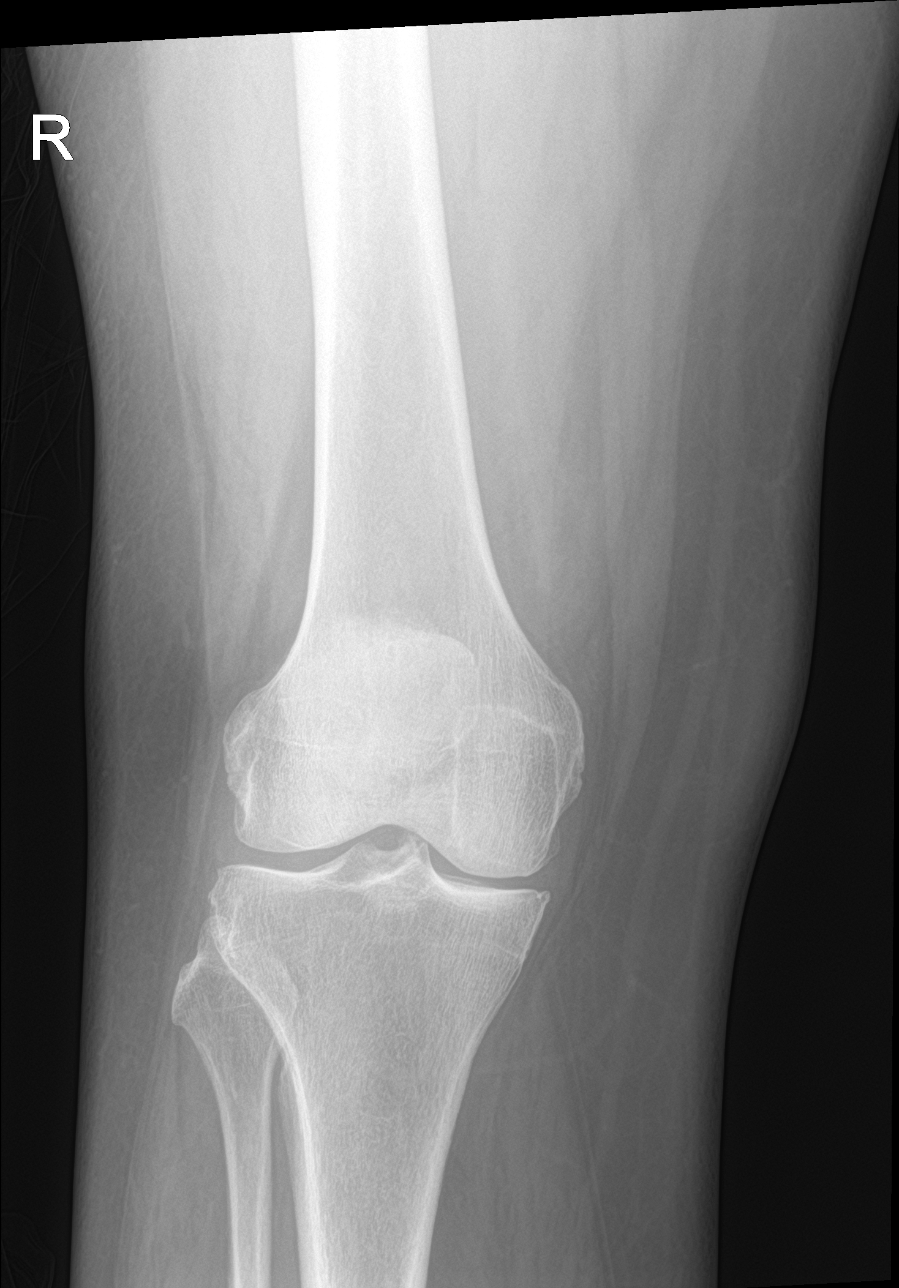

[4 of 4 positions shown; findings below may reference images not displayed]

FINDINGS: There is no acute displaced fracture or dislocation. No significant
joint effusion. There are tricompartmental degenerative changes,
greatest within the medial compartment. There is no significant
prepatellar soft tissue swelling.
IMPRESSION: 1. No acute bony abnormality.
2. Tricompartmental degenerative changes, greatest in the medial
compartment.

## 2021-08-11 ENCOUNTER — Encounter: Payer: Self-pay | Admitting: Family Medicine

## 2021-08-11 ENCOUNTER — Telehealth: Payer: Self-pay

## 2021-08-11 ENCOUNTER — Ambulatory Visit: Payer: Self-pay | Admitting: Family Medicine

## 2021-08-11 DIAGNOSIS — N760 Acute vaginitis: Secondary | ICD-10-CM

## 2021-08-11 DIAGNOSIS — Z299 Encounter for prophylactic measures, unspecified: Secondary | ICD-10-CM

## 2021-08-11 DIAGNOSIS — B9689 Other specified bacterial agents as the cause of diseases classified elsewhere: Secondary | ICD-10-CM

## 2021-08-11 DIAGNOSIS — Z113 Encounter for screening for infections with a predominantly sexual mode of transmission: Secondary | ICD-10-CM

## 2021-08-11 LAB — WET PREP FOR TRICH, YEAST, CLUE
Clue Cell Exam: POSITIVE — AB
Trichomonas Exam: NEGATIVE
Yeast Exam: NEGATIVE

## 2021-08-11 LAB — HM HIV SCREENING LAB: HM HIV Screening: NEGATIVE

## 2021-08-11 LAB — HEPATITIS B SURFACE ANTIGEN: Hepatitis B Surface Ag: NONREACTIVE

## 2021-08-11 LAB — HM HEPATITIS C SCREENING LAB: HM Hepatitis Screen: NEGATIVE

## 2021-08-11 MED ORDER — CLOTRIMAZOLE 1 % VA CREA: 1.0000 | TOPICAL_CREAM | Freq: Every day | VAGINAL | 0 refills | Status: AC

## 2021-08-11 MED ORDER — METRONIDAZOLE 500 MG PO TABS: 500.0000 mg | ORAL_TABLET | Freq: Two times a day (BID) | ORAL | 0 refills | Status: AC

## 2021-08-11 NOTE — Telephone Encounter (Signed)
Received phone call from Ventura Sellers with [facility]. Requests that pt be fit into our schedule if at all possible. 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 DIS requesting [facility] to do testing and tx today.  Appt scheduled for 08/11/21. ?Pt has anaphylaxis reaction to PCN listed in chart. ? ?Provider, Junious Dresser, FNP, informed about appt and details above. ?

## 2021-08-11 NOTE — Progress Notes (Signed)
Pt here for STD screening.  Wet mount results reviewed and medication dispensed per Provider orders.  Condoms declined.  Windle Guard, RN ? ?

## 2021-08-12 NOTE — Telephone Encounter (Signed)
Per request by Junious Dresser, FNP on 08/12/21: ? ?Keep check on RPR results for when available (ordered 08/11/21) ? ?Was contact to syphilis.  Not treated during 08/11/21 visit. ? ?If she is negative then retesting after 09/11/21 (90 day period from exposure) for confirmation. ?

## 2021-08-12 NOTE — Progress Notes (Signed)
[facility] Department ? ?STI clinic/screening visit ?[place] [address] ?[phone] ? ?Subjective:  ?Rebecca Colon is a 61 y.o. female being seen today for an STI screening visit. The patient reports they do not have symptoms.  Patient reports that they do not desire a pregnancy in the next year.   They reported they are not interested in discussing contraception today.   ? ?No LMP recorded. Patient is postmenopausal. ? ? ?Patient has the following medical conditions:   ?Patient Active Problem List  ? Diagnosis Date Noted  ? Chronic narcotic use 03/18/2019  ? Seizure disorder ([place]) 03/18/2019  ? COPD (chronic obstructive pulmonary disease) ([place]) 03/18/2019  ? Polypharmacy 03/18/2019  ? Altered mental status 03/18/2019  ? Cerebral ischemia on CT head 03/18/2019  ? Stroke [facility]) 03/18/2019  ? Acute metabolic encephalopathy 55/97/4163  ? Cellulitis of breast 11/12/2017  ? Cellulitis 11/11/2017  ? UTI (urinary tract infection) 07/08/2017  ? Anxiety 07/08/2017  ? Depression 07/08/2017  ? ? ?Chief Complaint  ?Patient presents with  ? SEXUALLY TRANSMITTED DISEASE  ?  Contact to Syphilis  ? ? ?HPI ? ?Patient reports here for screening, a contact to syphilis  ? ?Last HIV test per patient/review of record was 2020 ?Patient reports last pap was with endometrial ablation "some years ago"  ? ?Screening for MPX risk: ?Does the patient have an unexplained rash? No ?Is the patient MSM? No ?Does the patient endorse multiple sex partners or anonymous sex partners? No ?Did the patient have close or sexual contact with a person diagnosed with MPX? No ?Has the patient traveled outside the [place] where MPX is endemic? No ?Is there a high clinical suspicion for MPX-- evidenced by one of the following No ? -Unlikely to be chickenpox ? -Lymphadenopathy ? -Rash that present in same phase of evolution on any given body part ?See flowsheet for further details and programmatic requirements.  ? ?Immunization  history:  ?Immunization History  ?Administered Date(s) Administered  ? Pneumococcal Polysaccharide-23 07/09/2017  ?  ? ?The following portions of the patient's history were reviewed and updated as appropriate: allergies, current medications, past medical history, past social history, past surgical history and problem list. ? ?Objective:  ?There were no vitals filed for this visit. ? ?Physical Exam ?Vitals and nursing note reviewed.  ?Constitutional:   ?   Appearance: Normal appearance.  ?HENT:  ?   Head: Normocephalic and atraumatic.  ?   Mouth/Throat:  ?   Mouth: Mucous membranes are moist.  ?   Pharynx: Oropharynx is clear. No oropharyngeal exudate or posterior oropharyngeal erythema.  ?Pulmonary:  ?   Effort: Pulmonary effort is normal.  ?Abdominal:  ?   General: Abdomen is flat.  ?   Palpations: There is no mass.  ?   Tenderness: There is no abdominal tenderness. There is no rebound.  ?Genitourinary: ?   Exam position: Lithotomy position.  ?   Pubic Area: No rash or pubic lice.   ?   Labia:     ?   Right: No rash or lesion.     ?   Left: No rash or lesion.   ?   Vagina: No erythema, bleeding or lesions.  ?   Cervix: No cervical motion tenderness, discharge, friability, lesion or erythema.  ?   Uterus: Normal.   ?   Adnexa: Right adnexa normal and left adnexa normal.  ?   Comments: Pt self collected  ?Lymphadenopathy:  ?  Head:  ?   Right side of head: No preauricular or posterior auricular adenopathy.  ?   Left side of head: No preauricular or posterior auricular adenopathy.  ?   Cervical: No cervical adenopathy.  ?   Upper Body:  ?   Right upper body: No supraclavicular or axillary adenopathy.  ?   Left upper body: No supraclavicular or axillary adenopathy.  ?   Lower Body: No right inguinal adenopathy. No left inguinal adenopathy.  ?Skin: ?   General: Skin is warm and dry.  ?   Findings: No rash.  ?Neurological:  ?   Mental Status: She is alert and oriented to person, place, and time.  ?Psychiatric:     ?    Mood and Affect: Mood normal.     ?   Behavior: Behavior normal.  ? ? ? ?Assessment and Plan:  ?[name] is a 61 y.o. female presenting to the [facility] Department for STI screening ? ?1. Screening examination for venereal disease ?Patient accepted all screenings including wet prep, vaginal CT/GC and bloodwork for HIV/RPR.  ?Patient meets criteria for HepB screening? Yes. Ordered? Yes ?Patient meets criteria for HepC screening? Yes. Ordered? Yes ? ?Wet prep results +amine, + clue   ?Treatment needed  ?Discussed time line for [facility] results and that patient will be called with positive results and encouraged patient to call if she had not heard in 2 weeks.  ? ?With patients health history and allergies, and need for other antibiotics waiting until test results for treatment as contact to syphilis.  If test results are negative then patient to RTC for  retesting after 09/11/21 (90 day period from exposure) for confirmation.   ? ?Counseled to return or seek care for continued or worsening symptoms ?Recommended condom use with all sex ? ?Patient is currently using  endometrial ablation  to prevent pregnancy.  ?- HIV/HCV [facility] ?- WET PREP FOR TRICH, YEAST, CLUE ?- Syphilis Serology, [facility] ?- Chlamydia/Gonorrhea [facility] ?- Hepatitis Serology, [facility] ? ?2. Bacterial vaginosis ?Pt treated  ?- metroNIDAZOLE (FLAGYL) 500 MG tablet; Take 1 tablet (500 mg total) by mouth 2 (two) times daily for 7 days.  Dispense: 14 tablet; Refill: 0 ? ?3. Prophylactic measure ?Treated for yeast d/t high risk for getting yeast with antibiotics  ?- clotrimazole (GYNE-LOTRIMIN) 1 % vaginal cream; Place 1 Applicatorful vaginally at bedtime for 7 days.  Dispense: 45 g; Refill: 0 ? ? ? ? ?No follow-ups on file. ? ?No future appointments. ? ?Junious Dresser, FNP ?

## 2021-08-23 NOTE — Telephone Encounter (Signed)
08/11/21 RPR non-reactive.  Call pt to schedule follow-up appt. Appt to be after 09/11/21.

## 2021-08-24 NOTE — Telephone Encounter (Signed)
Phone call to pt to schedule follow-up bloodwork for syphilis.  Pt scheduled for 09/12/21.

## 2021-09-12 ENCOUNTER — Ambulatory Visit: Payer: Self-pay | Admitting: Nurse Practitioner

## 2021-09-12 ENCOUNTER — Encounter: Payer: Self-pay | Admitting: Nurse Practitioner

## 2021-09-12 DIAGNOSIS — Z113 Encounter for screening for infections with a predominantly sexual mode of transmission: Secondary | ICD-10-CM

## 2021-09-12 NOTE — Progress Notes (Signed)
[facility] Department  STI clinic/screening visit [place] [address] [phone]  Subjective:  Rebecca Colon is a 61 y.o. female being seen today for an STI screening visit. The patient reports they do not have symptoms.  Patient reports that they do not desire a pregnancy in the next year.   They reported they are not interested in discussing contraception today.  Patient is postmenopausal.    No LMP recorded. Patient is postmenopausal.   Patient has the following medical conditions:   Patient Active Problem List   Diagnosis Date Noted   Chronic narcotic use 03/18/2019   Seizure disorder ([place]) 03/18/2019   COPD (chronic obstructive pulmonary disease) ([place]) 03/18/2019   Polypharmacy 03/18/2019   Altered mental status 03/18/2019   Cerebral ischemia on CT head 03/18/2019   Stroke ([place]) 42/70/6237   Acute metabolic encephalopathy 62/83/1517   Cellulitis of breast 11/12/2017   Cellulitis 11/11/2017   UTI (urinary tract infection) 07/08/2017   Anxiety 07/08/2017   Depression 07/08/2017    Chief Complaint  Patient presents with   SEXUALLY TRANSMITTED DISEASE    Screening    HPI  Patient reports to clinic today for repeat blood work.  Patient states she was notified as a potential contact to Syphilis.  Patient was here in clinic on 08/11/21 for routine STD screening.  Here today for repeat blood work for Syphilis.  Last HIV test per patient/review of record was 08/11/21 Patient reports last pap was 11/30/2017.   Screening for MPX risk: Does the patient have an unexplained rash? No Is the patient MSM? No Does the patient endorse multiple sex partners or anonymous sex partners? No Did the patient have close or sexual contact with a person diagnosed with MPX? No Has the patient traveled outside the [place] where MPX is endemic? No Is there a high clinical suspicion for MPX-- evidenced by one of the following No  -Unlikely to be  chickenpox  -Lymphadenopathy  -Rash that present in same phase of evolution on any given body part See flowsheet for further details and programmatic requirements.   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  Objective:  There were no vitals filed for this visit.  Physical Exam Constitutional:      Appearance: Normal appearance.  HENT:     Head: Normocephalic.     Right Ear: External ear normal.     Left Ear: External ear normal.     Nose: Nose normal.     Mouth/Throat:     Lips: Pink.     Mouth: Mucous membranes are moist.     Comments: No visible signs of dental caries  Pulmonary:     Effort: Pulmonary effort is normal.  Musculoskeletal:     Cervical back: Full passive range of motion without pain and normal range of motion.  Skin:    General: Skin is warm and dry.  Neurological:     Mental Status: She is alert and oriented to person, place, and time.  Psychiatric:        Attention and Perception: Attention normal.        Mood  and Affect: Mood normal.        Behavior: Behavior normal. Behavior is cooperative.      Assessment and Plan:  Rebecca Colon is a 61 y.o. female presenting to the [facility] Department for STI screening  1. Screening examination for venereal disease -61 year old female in clinic for STD screening.  -Patient accepted screening for blood work RPR.  -Patient states she was notified as a potential contact to Syphilis.  Patient was here in clinic on 08/11/21 for routine STD screening.  Here today for repeat blood  work for Syphilis per recommendations from provider during last month's visit.  At this time patient opts to not take treatment prophylactic due to allergy to penicillin and month long treatment that may lead to a yeast infection  Patient will continue to monitor for signs and symptoms of Syphilis and have routine screenings. For Syphilis.   Patient meets criteria for HepB screening? No. Ordered? No - previously had testing done  Patient meets criteria for HepC screening? No. Ordered? No - previously had testing done.    Treat wet prep per standing order Discussed time line for State Lab results and that patient will be called with positive results and encouraged patient to call if she had not heard in 2 weeks.  Counseled to return or seek care for continued or worsening symptoms Recommended condom use with all sex  Patient is currently not using  contraception  to prevent pregnancy.  Patient is postmenopausal.    - Syphilis Serology, [facility]     Return if symptoms worsen or fail to improve.    Gregary Cromer, FNP

## 2021-09-13 NOTE — Telephone Encounter (Signed)
Pt kept follow-up appt for additional syphilis testing on 09/12/21.  Results pending.

## 2021-09-22 NOTE — Telephone Encounter (Addendum)
Results reviewed and verbal by Gregary Cromer, FNP is no further testing and no tx needed.   Call pt with TR.  Phone call to pt at [phone]. Left message on voicemail that last result at [facility] was negative, no further testing and no tx indicated at this time. Please call Sona Nations at [phone] with any questions.
